# Patient Record
Sex: Female | Born: 1955 | Race: White | Hispanic: No | Smoking: Current every day smoker
Health system: Southern US, Community
[De-identification: ages and names within clinical notes are randomized; demographics above are authoritative.]

## PROBLEM LIST (undated history)

## (undated) DIAGNOSIS — J449 Chronic obstructive pulmonary disease, unspecified: Secondary | ICD-10-CM

## (undated) DIAGNOSIS — E785 Hyperlipidemia, unspecified: Secondary | ICD-10-CM

## (undated) DIAGNOSIS — Z9889 Other specified postprocedural states: Secondary | ICD-10-CM

## (undated) DIAGNOSIS — I10 Essential (primary) hypertension: Secondary | ICD-10-CM

## (undated) DIAGNOSIS — Z820 Family history of epilepsy and other diseases of the nervous system: Secondary | ICD-10-CM

## (undated) DIAGNOSIS — F419 Anxiety disorder, unspecified: Secondary | ICD-10-CM

## (undated) DIAGNOSIS — M549 Dorsalgia, unspecified: Secondary | ICD-10-CM

## (undated) DIAGNOSIS — G8929 Other chronic pain: Secondary | ICD-10-CM

## (undated) DIAGNOSIS — R112 Nausea with vomiting, unspecified: Secondary | ICD-10-CM

## (undated) DIAGNOSIS — M1990 Unspecified osteoarthritis, unspecified site: Secondary | ICD-10-CM

## (undated) DIAGNOSIS — K219 Gastro-esophageal reflux disease without esophagitis: Secondary | ICD-10-CM

## (undated) DIAGNOSIS — E039 Hypothyroidism, unspecified: Secondary | ICD-10-CM

## (undated) DIAGNOSIS — Z8719 Personal history of other diseases of the digestive system: Secondary | ICD-10-CM

## (undated) HISTORY — PX: BREAST EXCISIONAL BIOPSY: SUR124

## (undated) HISTORY — PX: BACK SURGERY: SHX140

## (undated) HISTORY — PX: TUBAL LIGATION: SHX77

## (undated) HISTORY — PX: OTHER SURGICAL HISTORY: SHX169

---

## 1999-12-31 ENCOUNTER — Encounter: Admission: RE | Admit: 1999-12-31 | Discharge: 1999-12-31 | Payer: Self-pay | Admitting: Family Medicine

## 1999-12-31 ENCOUNTER — Encounter: Payer: Self-pay | Admitting: Family Medicine

## 2000-01-31 ENCOUNTER — Encounter: Payer: Self-pay | Admitting: Neurological Surgery

## 2000-02-02 ENCOUNTER — Ambulatory Visit (HOSPITAL_COMMUNITY): Admission: RE | Admit: 2000-02-02 | Discharge: 2000-02-03 | Payer: Self-pay | Admitting: Neurological Surgery

## 2000-02-02 ENCOUNTER — Encounter: Payer: Self-pay | Admitting: Neurological Surgery

## 2001-05-06 ENCOUNTER — Emergency Department (HOSPITAL_COMMUNITY): Admission: EM | Admit: 2001-05-06 | Discharge: 2001-05-07 | Payer: Self-pay | Admitting: Emergency Medicine

## 2009-09-20 ENCOUNTER — Emergency Department (HOSPITAL_COMMUNITY): Admission: EM | Admit: 2009-09-20 | Discharge: 2009-09-20 | Payer: Self-pay | Admitting: Emergency Medicine

## 2011-01-16 ENCOUNTER — Emergency Department (HOSPITAL_COMMUNITY)
Admission: EM | Admit: 2011-01-16 | Discharge: 2011-01-16 | Disposition: A | Discharge status: Home / Self Care | Payer: BC Managed Care – PPO | Attending: Emergency Medicine | Admitting: Emergency Medicine

## 2011-01-16 DIAGNOSIS — M129 Arthropathy, unspecified: Secondary | ICD-10-CM | POA: Insufficient documentation

## 2011-01-16 DIAGNOSIS — Z79899 Other long term (current) drug therapy: Secondary | ICD-10-CM | POA: Insufficient documentation

## 2011-01-16 DIAGNOSIS — M549 Dorsalgia, unspecified: Secondary | ICD-10-CM | POA: Insufficient documentation

## 2011-01-16 DIAGNOSIS — Z139 Encounter for screening, unspecified: Secondary | ICD-10-CM | POA: Insufficient documentation

## 2011-01-16 DIAGNOSIS — E7800 Pure hypercholesterolemia, unspecified: Secondary | ICD-10-CM | POA: Insufficient documentation

## 2011-01-16 DIAGNOSIS — G8929 Other chronic pain: Secondary | ICD-10-CM | POA: Insufficient documentation

## 2011-01-16 LAB — CBC
HCT: 41.4 % (ref 36.0–46.0)
Hemoglobin: 13.9 g/dL (ref 12.0–15.0)
RBC: 5.06 MIL/uL (ref 3.87–5.11)

## 2011-01-16 LAB — RAPID URINE DRUG SCREEN, HOSP PERFORMED
Amphetamines: NOT DETECTED
Barbiturates: NOT DETECTED
Benzodiazepines: POSITIVE — AB
Opiates: NOT DETECTED

## 2011-01-16 LAB — COMPREHENSIVE METABOLIC PANEL
ALT: 24 U/L (ref 0–35)
Alkaline Phosphatase: 112 U/L (ref 39–117)
CO2: 29 mEq/L (ref 19–32)
Chloride: 106 mEq/L (ref 96–112)
GFR calc non Af Amer: >60 mL/min (ref >60)
Glucose, Bld: 99 mg/dL (ref 70–99)
Potassium: 3.5 mEq/L (ref 3.5–5.1)
Sodium: 143 mEq/L (ref 135–145)
Total Protein: 7.4 g/dL (ref 6.0–8.3)

## 2011-01-16 LAB — URINALYSIS, ROUTINE W REFLEX MICROSCOPIC
Bilirubin Urine: NEGATIVE
Hgb urine dipstick: NEGATIVE
Ketones, ur: NEGATIVE mg/dL
Protein, ur: NEGATIVE mg/dL
Urobilinogen, UA: 0.2 mg/dL (ref 0.0–1.0)

## 2011-01-16 LAB — DIFFERENTIAL
Basophils Absolute: 0.1 10*3/uL (ref 0.0–0.1)
Basophils Relative: 1 % (ref 0–1)
Lymphocytes Relative: 20 % (ref 12–46)
Monocytes Relative: 9 % (ref 3–12)
Neutro Abs: 6.1 10*3/uL (ref 1.7–7.7)
Neutrophils Relative %: 68 % (ref 43–77)

## 2011-01-16 LAB — ETHANOL: Alcohol, Ethyl (B): <5 mg/dL (ref 0–10)

## 2011-02-10 NOTE — Op Note (Signed)
[facility]  Patient:    Brooke Mccarthy, Brooke Mccarthy                     MRN: [id number] Proc. Date: 02/02/00 Adm. Date:  [id number] Disc. 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  OPERATION PERFORMED:  Left lumbar microdiskectomy with MET/RX system and microdissection technique.  SURGEON:  Stefani Dama, M.D.  ASSISTANT:  ANESTHESIA:  General endotracheal.  INDICATIONS FOR PROCEDURE:  The patient is a 55 year old individual who has had significant back and left lower extremity pain secondary to a recurrent herniated nucleus pulposus at the L4-5 level.  She was advised regarding surgery.  DESCRIPTION OF PROCEDURE:  The patient was brought to the operating room supine on a stretcher.  After smooth induction of general endotracheal anesthesia she was turned prone.  The back was shaved and prepped with DuraPrep and draped in sterile fashion.  The fluoroscopy unit was brought into view and AP and lateral fluoroscopy was utilized to isolate the L4 laminar arch on the left lateral aspect of the L4-5 interspace.  Then, by placing a K-wire on the inferior marginal L4 lamina, the inner cannula was placed after incision was made around the needle hole measuring approximately 14 mm in length.  The outer cannula was then driven down over the inner cannula and an area was wanded between the L4-5 interlaminar space.  The wanding continued until the 18 mm cannula was placed down to the wound.  Then the 18 mm trocar measuring 6 cm in depth was connected to the side arm on the operating room table to secure it.  Fluoroscopy again identified good position of the trocar. The microscope was then draped and  brought into the field and then using microdissection technique, a laminotomy was performed, removing the inferior margin of the lamina of L4 out to the mesial wall of the facet.  The electric high speed drill was then used to remove the inferior margin of bone out to the mesial wall of the facet and 2 and 3 mm Kerrison punches were used to remove bone so as to allow exposure of the common dural tube with lateral aspect of the common dural tube being exposed and taking out the yellow ligament in this region.  The edge of the tube and the L5 nerve root and its take off were identified.  Further dissection and release of scar tissue from this area allowed mobilization of the nerve which was tightly adhered and obviously dorsally displaced.  With further mobility, a disk fragment was visualized just on the lateral and ventral aspect of the nerve root and then with further dissection, the disk fragment was mobilized so that after significant amount of dissection was performed, a fragment of disk could be removed.  This allowed for some decompression of the common dural tube and the nerve root.  Further inspection then identified a second fragment and with dissection of this fragment using the available upbiting dissecting tools and bayonetted dental tools.  A second larger fragment of disk was removed from underneath the sac and the L5 nerve root.  This allowed for good decompression of the  nerve root itself.  Further release of scar tissue with microdissection technique allowed greater mobilization of the L5 nerve root and inspection then identified a rent in the posterior longitudinal ligament where the disk fragment had migrated from.  The disk space was then inspected and diskectomy was performed removing several other degenerated fragments of disk from within the disk space and freeing up any other loose material within the disk space and removing it in a piecemeal fashion.  The disk space  was then irrigated fully and once completely evacuated of any free remnants of disk material, the area around the nerve root was inspected.  Hemostasis from the soft tissues was obtained using the bipolar cautery and then the wound was closed by removing the cannula slowly and placing a single 3-0 stitch in the lumbar dorsal fascia on that left side. The skin was closed with 3-0 Vicryl in interrupted fashion.  The patient tolerated the procedure well.  Blood loss was essentially nil. DD:  02/02/00 TD:  02/05/00 Job: [id number]

## 2011-02-10 NOTE — H&P (Signed)
[place]  Patient:    Brooke Mccarthy, Brooke Mccarthy               MRN: [id number] Adm. Date:  02/02/00 Attending:  Stefani Dama, M.D.                         History and Physical  ADMISSION DIAGNOSES:  Herniated nucleus pulposus L4-L5 left, with left lumbar radiculopathy.  HISTORY OF PRESENT ILLNESS:  The patient is a 55 year old, right-handed individual who was treated for back and left lower extremity pain, secondary to a herniated nucleus pulposus at L4-5, resected in 1990.  She had had some intermittent occasional problems with back pain and was last seen in 1996, for a recurrence f back pain and some pain in the left lower extremity.  Conservative management was tolerated well; however, the patient about three months ago developed severe excruciating pain in the back and the left lower extremity that has been unrelenting.  She notes that this is more severe than had been previously.  She  notes that the pain is unbearable.  She cannot stand, sit, walk, or lie down, or get comfortable in any given position for any length of time.  Sleeping has been very difficult.  She finds it difficult to maintain any degree of comfort for any length of time.  She has been working despite the pain, and she has been taking  ibuprofen.  More recently she has been given Lodine, but she notes that this does not seem to help the pain.  Bowel and bladder control has not been effected. Coughing and sneezing do not seem to aggravate the pain or relieve it in any way.  PAST MEDICAL HISTORY:  Reveals that her general health has been good, though she has had hypertension some time ago.  There is some concern that the hypertension was related to ongoing back pain and leg pain.  She has tried previous breast biopsies for benign tumors on two occasions.  She has had surgery on the right oot with lower lumbar spine surgery done in 1990.  FAMILY HISTORY:   Reveals that her family history is negative.  Her father died n 84, of cancer, but there is no significant difficulty with liver, heart, or kidney disease that she can trace.  SOCIAL HISTORY:  She is separated.  She works as a Administrator, sports.  She has three children, all of them outside the home.  REVIEW OF SYSTEMS:  Notable for high blood pressure, leg weakness, back pain, and leg pain.  PHYSICAL EXAMINATION:  GENERAL:  She is an alert, oriented, cooperative individual, who is in moderate  distress with her left leg pain.  She does not maintain one given position while sitting for the interview and examination in the office.  She will stand straight and erect with some difficulty, but tends to prefer to lean against an object, s she has significant leg pain when standing for a few minutes too long.  She tends to feel a little more comfortable with the left leg crossed.  BACK:  Is able to flex 70 degrees.  There is tenderness to palpation across the  level of the L5 vertebra.  NEUROLOGIC:  Motor strength in the lower extremities reveals the iliopsoas, quadriceps, tibialis anterior, and gastrocnemius have normal strength, tone, and bulk.  Deep tendon reflexes are 2+ in the patellae, 1+ in the right Achilles, absent in the left Achilles.  Babinski is downgoing.  Sensation is diminished to vibration distally in the left lower extremity.  Pin sensation is diminished also from the level of the ankle to the foot.  Straight leg raising reproduces leg pain at 80 on the left side, and negative on the right side to 90 degrees.  Patricks  maneuver is negative bilaterally.  Cranial nerves examination reveals her pupils are 3.0 mm, briskly reactive to light and accommodation.  The extraocular movements are full.  The face is symmetric to grimace.  Tongue and uvula are in the midline. Sclerae and conjunctivae are clear.  NECK:  No masses, no bruits are heard.  LUNGS:  Clear to  auscultation.  HEART:  A regular rate and rhythm.  ABDOMEN:  Soft, bowel sounds positive.  No masses are palpable.  EXTREMITIES:  Reveal no cyanosis, clubbing, or edema.  VITAL SIGNS:  Blood pressure on initial examination is 156/94, heart rate 72 and regular, respirations 14 and unlabored.  IMPRESSION:  The patient has a herniated nucleus pulposus at L4-5 on the left with left lumbar radiculopathy.  PLAN:  She is now being admitted to undergo a surgical microdiskectomy and resection of disk herniation. DD:  02/02/00 TD:  02/02/00 Job: [id number]

## 2013-02-24 ENCOUNTER — Other Ambulatory Visit: Payer: Self-pay | Admitting: Family Medicine

## 2013-02-24 DIAGNOSIS — Z780 Asymptomatic menopausal state: Secondary | ICD-10-CM

## 2013-03-06 ENCOUNTER — Ambulatory Visit
Admission: RE | Admit: 2013-03-06 | Discharge: 2013-03-06 | Disposition: A | Discharge status: Home / Self Care | Payer: BC Managed Care – PPO | Source: Ambulatory Visit | Attending: Family Medicine | Admitting: Family Medicine

## 2013-03-06 DIAGNOSIS — Z780 Asymptomatic menopausal state: Secondary | ICD-10-CM

## 2013-10-10 ENCOUNTER — Inpatient Hospital Stay (HOSPITAL_COMMUNITY)
Admission: EM | Admit: 2013-10-10 | Discharge: 2013-10-11 | DRG: 482 | Disposition: A | Discharge status: Home / Self Care | Payer: BC Managed Care – PPO | Attending: Orthopedic Surgery | Admitting: Orthopedic Surgery

## 2013-10-10 ENCOUNTER — Inpatient Hospital Stay (HOSPITAL_COMMUNITY): Payer: BC Managed Care – PPO | Admitting: Anesthesiology

## 2013-10-10 ENCOUNTER — Other Ambulatory Visit: Payer: Self-pay | Admitting: Orthopedic Surgery

## 2013-10-10 ENCOUNTER — Inpatient Hospital Stay (HOSPITAL_COMMUNITY): Payer: BC Managed Care – PPO

## 2013-10-10 ENCOUNTER — Encounter (HOSPITAL_COMMUNITY): Payer: Self-pay | Admitting: Emergency Medicine

## 2013-10-10 ENCOUNTER — Emergency Department (HOSPITAL_COMMUNITY): Payer: BC Managed Care – PPO

## 2013-10-10 ENCOUNTER — Encounter (HOSPITAL_COMMUNITY): Payer: BC Managed Care – PPO | Admitting: Anesthesiology

## 2013-10-10 ENCOUNTER — Encounter (HOSPITAL_COMMUNITY)
Admission: EM | Disposition: A | Discharge status: Home / Self Care | Payer: Self-pay | Source: Home / Self Care | Attending: Orthopedic Surgery

## 2013-10-10 DIAGNOSIS — F17200 Nicotine dependence, unspecified, uncomplicated: Secondary | ICD-10-CM | POA: Diagnosis present

## 2013-10-10 DIAGNOSIS — Y92009 Unspecified place in unspecified non-institutional (private) residence as the place of occurrence of the external cause: Secondary | ICD-10-CM

## 2013-10-10 DIAGNOSIS — K219 Gastro-esophageal reflux disease without esophagitis: Secondary | ICD-10-CM | POA: Diagnosis present

## 2013-10-10 DIAGNOSIS — S7292XA Unspecified fracture of left femur, initial encounter for closed fracture: Secondary | ICD-10-CM | POA: Diagnosis present

## 2013-10-10 DIAGNOSIS — Z7901 Long term (current) use of anticoagulants: Secondary | ICD-10-CM

## 2013-10-10 DIAGNOSIS — F411 Generalized anxiety disorder: Secondary | ICD-10-CM | POA: Diagnosis present

## 2013-10-10 DIAGNOSIS — W010XXA Fall on same level from slipping, tripping and stumbling without subsequent striking against object, initial encounter: Secondary | ICD-10-CM | POA: Diagnosis present

## 2013-10-10 DIAGNOSIS — E785 Hyperlipidemia, unspecified: Secondary | ICD-10-CM | POA: Diagnosis present

## 2013-10-10 DIAGNOSIS — G8929 Other chronic pain: Secondary | ICD-10-CM | POA: Diagnosis present

## 2013-10-10 DIAGNOSIS — I10 Essential (primary) hypertension: Secondary | ICD-10-CM | POA: Diagnosis present

## 2013-10-10 DIAGNOSIS — S72343A Displaced spiral fracture of shaft of unspecified femur, initial encounter for closed fracture: Secondary | ICD-10-CM | POA: Diagnosis present

## 2013-10-10 DIAGNOSIS — S72309A Unspecified fracture of shaft of unspecified femur, initial encounter for closed fracture: Principal | ICD-10-CM | POA: Diagnosis present

## 2013-10-10 DIAGNOSIS — E039 Hypothyroidism, unspecified: Secondary | ICD-10-CM | POA: Diagnosis present

## 2013-10-10 DIAGNOSIS — Z79899 Other long term (current) drug therapy: Secondary | ICD-10-CM

## 2013-10-10 DIAGNOSIS — S72346A Nondisplaced spiral fracture of shaft of unspecified femur, initial encounter for closed fracture: Secondary | ICD-10-CM

## 2013-10-10 HISTORY — DX: Anxiety disorder, unspecified: F41.9

## 2013-10-10 HISTORY — DX: Hypothyroidism, unspecified: E03.9

## 2013-10-10 HISTORY — DX: Hyperlipidemia, unspecified: E78.5

## 2013-10-10 HISTORY — DX: Nausea with vomiting, unspecified: R11.2

## 2013-10-10 HISTORY — DX: Unspecified osteoarthritis, unspecified site: M19.90

## 2013-10-10 HISTORY — DX: Dorsalgia, unspecified: M54.9

## 2013-10-10 HISTORY — DX: Family history of epilepsy and other diseases of the nervous system: Z82.0

## 2013-10-10 HISTORY — DX: Personal history of other diseases of the digestive system: Z87.19

## 2013-10-10 HISTORY — DX: Other specified postprocedural states: Z98.890

## 2013-10-10 HISTORY — DX: Other chronic pain: G89.29

## 2013-10-10 HISTORY — PX: FEMUR IM NAIL: SHX1597

## 2013-10-10 HISTORY — DX: Gastro-esophageal reflux disease without esophagitis: K21.9

## 2013-10-10 HISTORY — DX: Essential (primary) hypertension: I10

## 2013-10-10 LAB — CBC WITH DIFFERENTIAL/PLATELET
Basophils Absolute: 0 10*3/uL (ref 0.0–0.1)
Basophils Relative: 0 % (ref 0–1)
EOS ABS: 0 10*3/uL (ref 0.0–0.7)
EOS PCT: 0 % (ref 0–5)
HCT: 43.3 % (ref 36.0–46.0)
HEMOGLOBIN: 14.5 g/dL (ref 12.0–15.0)
LYMPHS ABS: 1.1 10*3/uL (ref 0.7–4.0)
LYMPHS PCT: 8 % — AB (ref 12–46)
MCH: 27.5 pg (ref 26.0–34.0)
MCHC: 33.5 g/dL (ref 30.0–36.0)
MCV: 82.2 fL (ref 78.0–100.0)
MONOS PCT: 7 % (ref 3–12)
Monocytes Absolute: 0.9 10*3/uL (ref 0.1–1.0)
NEUTROS PCT: 85 % — AB (ref 43–77)
Neutro Abs: 11.3 10*3/uL — ABNORMAL HIGH (ref 1.7–7.7)
PLATELETS: 314 10*3/uL (ref 150–400)
RBC: 5.27 MIL/uL — AB (ref 3.87–5.11)
RDW: 13.5 % (ref 11.5–15.5)
WBC: 13.4 10*3/uL — AB (ref 4.0–10.5)

## 2013-10-10 LAB — SURGICAL PCR SCREEN
MRSA, PCR: NEGATIVE
Staphylococcus aureus: NEGATIVE

## 2013-10-10 LAB — BASIC METABOLIC PANEL
BUN: 16 mg/dL (ref 6–23)
CO2: 26 meq/L (ref 19–32)
Calcium: 9.8 mg/dL (ref 8.4–10.5)
Chloride: 99 mEq/L (ref 96–112)
Creatinine, Ser: 0.91 mg/dL (ref 0.50–1.10)
GFR calc Af Amer: 78 mL/min — ABNORMAL LOW (ref >90)
GFR calc non Af Amer: 68 mL/min — ABNORMAL LOW (ref >90)
GLUCOSE: 135 mg/dL — AB (ref 70–99)
POTASSIUM: 4.9 meq/L (ref 3.7–5.3)
SODIUM: 139 meq/L (ref 137–147)

## 2013-10-10 SURGERY — INSERTION, INTRAMEDULLARY ROD, FEMUR, RETROGRADE
Anesthesia: General | Site: Leg Upper | Laterality: Left

## 2013-10-10 MED ORDER — DOCUSATE SODIUM 100 MG PO CAPS
100.0000 mg | ORAL_CAPSULE | Freq: Two times a day (BID) | ORAL | Status: DC
  Administered 2013-10-10 – 2013-10-11 (×2): 100 mg via ORAL
  Filled 2013-10-10 (×3): qty 1

## 2013-10-10 MED ORDER — MORPHINE SULFATE 2 MG/ML IJ SOLN: 1.0000 mg | INTRAMUSCULAR | Status: DC | PRN

## 2013-10-10 MED ORDER — OXYCODONE-ACETAMINOPHEN 7.5-325 MG PO TABS
1.0000 | ORAL_TABLET | ORAL | Status: DC | PRN
  Filled 2013-10-10: qty 1

## 2013-10-10 MED ORDER — ROCURONIUM BROMIDE 100 MG/10ML IV SOLN
INTRAVENOUS | Status: DC | PRN
  Administered 2013-10-10: 10 mg via INTRAVENOUS
  Administered 2013-10-10: 50 mg via INTRAVENOUS

## 2013-10-10 MED ORDER — OXYCODONE HCL 5 MG PO TABS: 5.0000 mg | ORAL_TABLET | ORAL | Status: DC | PRN

## 2013-10-10 MED ORDER — ONDANSETRON HCL 4 MG PO TABS: 4.0000 mg | ORAL_TABLET | Freq: Four times a day (QID) | ORAL | Status: DC | PRN

## 2013-10-10 MED ORDER — ALPRAZOLAM 0.5 MG PO TABS
1.0000 mg | ORAL_TABLET | Freq: Two times a day (BID) | ORAL | Status: DC
  Administered 2013-10-11 (×2): 1 mg via ORAL
  Filled 2013-10-10: qty 2
  Filled 2013-10-10: qty 4
  Filled 2013-10-10: qty 2

## 2013-10-10 MED ORDER — ACETAMINOPHEN 500 MG PO TABS: 1000.0000 mg | ORAL_TABLET | Freq: Once | ORAL | Status: DC

## 2013-10-10 MED ORDER — OXYCODONE-ACETAMINOPHEN 5-325 MG PO TABS: 1.0000 | ORAL_TABLET | ORAL | Status: DC | PRN

## 2013-10-10 MED ORDER — SUCCINYLCHOLINE CHLORIDE 20 MG/ML IJ SOLN
INTRAMUSCULAR | Status: DC | PRN
  Administered 2013-10-10: 70 mg via INTRAVENOUS

## 2013-10-10 MED ORDER — PROPOFOL 10 MG/ML IV BOLUS
INTRAVENOUS | Status: DC | PRN
  Administered 2013-10-10: 200 mg via INTRAVENOUS

## 2013-10-10 MED ORDER — LEVOTHYROXINE SODIUM 100 MCG PO TABS
100.0000 ug | ORAL_TABLET | Freq: Every day | ORAL | Status: DC
  Administered 2013-10-11: 100 ug via ORAL
  Filled 2013-10-10 (×2): qty 1

## 2013-10-10 MED ORDER — LIDOCAINE HCL (CARDIAC) 20 MG/ML IV SOLN
INTRAVENOUS | Status: DC | PRN
  Administered 2013-10-10: 50 mg via INTRAVENOUS

## 2013-10-10 MED ORDER — SODIUM CHLORIDE 0.9 % IV SOLN
INTRAVENOUS | Status: DC
  Administered 2013-10-10: 12:00:00 via INTRAVENOUS

## 2013-10-10 MED ORDER — BACITRACIN ZINC 500 UNIT/GM EX OINT
TOPICAL_OINTMENT | CUTANEOUS | Status: AC
  Filled 2013-10-10: qty 15

## 2013-10-10 MED ORDER — GLYCOPYRROLATE 0.2 MG/ML IJ SOLN
INTRAMUSCULAR | Status: DC | PRN
  Administered 2013-10-10: .8 mg via INTRAVENOUS

## 2013-10-10 MED ORDER — OXYCODONE HCL 5 MG PO TABS: 2.5000 mg | ORAL_TABLET | ORAL | Status: DC | PRN

## 2013-10-10 MED ORDER — CEFAZOLIN SODIUM-DEXTROSE 2-3 GM-% IV SOLR
2.0000 g | INTRAVENOUS | Status: DC
  Filled 2013-10-10: qty 50

## 2013-10-10 MED ORDER — NEOSTIGMINE METHYLSULFATE 1 MG/ML IJ SOLN
INTRAMUSCULAR | Status: DC | PRN
  Administered 2013-10-10: 5 mg via INTRAVENOUS

## 2013-10-10 MED ORDER — CEFAZOLIN SODIUM-DEXTROSE 2-3 GM-% IV SOLR
INTRAVENOUS | Status: AC
  Administered 2013-10-10: 2 g via INTRAVENOUS
  Filled 2013-10-10: qty 50

## 2013-10-10 MED ORDER — SODIUM CHLORIDE 0.9 % IV SOLN: INTRAVENOUS | Status: DC

## 2013-10-10 MED ORDER — ONDANSETRON HCL 4 MG/2ML IJ SOLN: 4.0000 mg | Freq: Once | INTRAMUSCULAR | Status: DC | PRN

## 2013-10-10 MED ORDER — HYDROMORPHONE HCL PF 1 MG/ML IJ SOLN
1.0000 mg | Freq: Once | INTRAMUSCULAR | Status: AC | PRN
  Administered 2013-10-10: 1 mg via INTRAVENOUS
  Filled 2013-10-10: qty 1

## 2013-10-10 MED ORDER — OXYCODONE HCL 5 MG PO TABS
5.0000 mg | ORAL_TABLET | ORAL | Status: DC | PRN
  Administered 2013-10-11 (×3): 5 mg via ORAL
  Filled 2013-10-10: qty 2
  Filled 2013-10-10 (×2): qty 1

## 2013-10-10 MED ORDER — SODIUM CHLORIDE 0.9 % IV SOLN
INTRAVENOUS | Status: AC
  Administered 2013-10-10: 14:00:00 via INTRAVENOUS

## 2013-10-10 MED ORDER — FENTANYL CITRATE 0.05 MG/ML IJ SOLN
INTRAMUSCULAR | Status: DC | PRN
  Administered 2013-10-10: 50 ug via INTRAVENOUS
  Administered 2013-10-10: 100 ug via INTRAVENOUS
  Administered 2013-10-10: 50 ug via INTRAVENOUS

## 2013-10-10 MED ORDER — METOCLOPRAMIDE HCL 10 MG PO TABS: 5.0000 mg | ORAL_TABLET | Freq: Three times a day (TID) | ORAL | Status: DC | PRN

## 2013-10-10 MED ORDER — MELOXICAM 15 MG PO TABS
15.0000 mg | ORAL_TABLET | Freq: Every day | ORAL | Status: DC
  Administered 2013-10-11: 15 mg via ORAL
  Filled 2013-10-10: qty 1

## 2013-10-10 MED ORDER — ONDANSETRON HCL 4 MG/2ML IJ SOLN
INTRAMUSCULAR | Status: DC | PRN
  Administered 2013-10-10: 4 mg via INTRAVENOUS

## 2013-10-10 MED ORDER — SIMVASTATIN 5 MG PO TABS
5.0000 mg | ORAL_TABLET | Freq: Every day | ORAL | Status: DC
  Filled 2013-10-10: qty 1

## 2013-10-10 MED ORDER — DILTIAZEM HCL ER 240 MG PO CP24
240.0000 mg | ORAL_CAPSULE | Freq: Every day | ORAL | Status: DC
  Administered 2013-10-11: 240 mg via ORAL
  Filled 2013-10-10: qty 1

## 2013-10-10 MED ORDER — MIDAZOLAM HCL 5 MG/5ML IJ SOLN
INTRAMUSCULAR | Status: DC | PRN
  Administered 2013-10-10: 2 mg via INTRAVENOUS

## 2013-10-10 MED ORDER — LACTATED RINGERS IV SOLN
INTRAVENOUS | Status: DC | PRN
  Administered 2013-10-10 (×2): via INTRAVENOUS

## 2013-10-10 MED ORDER — 0.9 % SODIUM CHLORIDE (POUR BTL) OPTIME
TOPICAL | Status: DC | PRN
  Administered 2013-10-10: 1000 mL

## 2013-10-10 MED ORDER — LACTATED RINGERS IV SOLN
INTRAVENOUS | Status: DC
  Administered 2013-10-10: 50 mL/h via INTRAVENOUS

## 2013-10-10 MED ORDER — HYDROMORPHONE HCL PF 1 MG/ML IJ SOLN
0.2500 mg | INTRAMUSCULAR | Status: DC | PRN
  Administered 2013-10-10 (×2): 0.25 mg via INTRAVENOUS

## 2013-10-10 MED ORDER — SENNA 8.6 MG PO TABS
1.0000 | ORAL_TABLET | Freq: Two times a day (BID) | ORAL | Status: DC
  Administered 2013-10-10 – 2013-10-11 (×2): 8.6 mg via ORAL
  Filled 2013-10-10 (×3): qty 1

## 2013-10-10 MED ORDER — ONDANSETRON HCL 4 MG/2ML IJ SOLN: 4.0000 mg | Freq: Four times a day (QID) | INTRAMUSCULAR | Status: DC | PRN

## 2013-10-10 MED ORDER — HYDROMORPHONE HCL PF 1 MG/ML IJ SOLN
1.0000 mg | Freq: Once | INTRAMUSCULAR | Status: AC
  Administered 2013-10-10: 1 mg via INTRAVENOUS
  Filled 2013-10-10: qty 1

## 2013-10-10 MED ORDER — ENOXAPARIN SODIUM 30 MG/0.3ML ~~LOC~~ SOLN
30.0000 mg | SUBCUTANEOUS | Status: DC
  Administered 2013-10-10: 30 mg via SUBCUTANEOUS
  Filled 2013-10-10 (×2): qty 0.3

## 2013-10-10 MED ORDER — HYDROMORPHONE HCL PF 1 MG/ML IJ SOLN
INTRAMUSCULAR | Status: AC
  Filled 2013-10-10: qty 1

## 2013-10-10 MED ORDER — SODIUM CHLORIDE 0.9 % IV SOLN
INTRAVENOUS | Status: DC
  Administered 2013-10-10: 23:00:00 via INTRAVENOUS

## 2013-10-10 MED ORDER — CHLORHEXIDINE GLUCONATE 4 % EX LIQD
1.0000 "application " | Freq: Once | CUTANEOUS | Status: DC
  Filled 2013-10-10: qty 15

## 2013-10-10 MED ORDER — ENOXAPARIN SODIUM 40 MG/0.4ML ~~LOC~~ SOLN: 40.0000 mg | SUBCUTANEOUS | Status: DC

## 2013-10-10 MED ORDER — ROPINIROLE HCL 0.5 MG PO TABS
0.5000 mg | ORAL_TABLET | Freq: Every evening | ORAL | Status: DC | PRN
  Filled 2013-10-10: qty 2

## 2013-10-10 MED ORDER — METOCLOPRAMIDE HCL 5 MG/ML IJ SOLN: 5.0000 mg | Freq: Three times a day (TID) | INTRAMUSCULAR | Status: DC | PRN

## 2013-10-10 MED ORDER — SPIRONOLACTONE-HCTZ 25-25 MG PO TABS
1.0000 | ORAL_TABLET | Freq: Every day | ORAL | Status: DC
  Filled 2013-10-10: qty 1

## 2013-10-10 MED ORDER — CHLORHEXIDINE GLUCONATE 4 % EX LIQD
60.0000 mL | Freq: Once | CUTANEOUS | Status: DC
  Filled 2013-10-10: qty 60

## 2013-10-10 SURGICAL SUPPLY — 58 items
BANDAGE ELASTIC 4 VELCRO ST LF (GAUZE/BANDAGES/DRESSINGS) ×3 IMPLANT
BANDAGE ESMARK 6X9 LF (GAUZE/BANDAGES/DRESSINGS) IMPLANT
BIT DRILL CALIBRATED 4.3MMX365 (DRILL) IMPLANT
BIT DRILL CROWE PNT TWST 4.5MM (DRILL) IMPLANT
BNDG CMPR 9X6 STRL LF SNTH (GAUZE/BANDAGES/DRESSINGS)
BNDG CMPR MED 15X6 ELC VLCR LF (GAUZE/BANDAGES/DRESSINGS) ×1
BNDG COHESIVE 4X5 TAN STRL (GAUZE/BANDAGES/DRESSINGS) ×3 IMPLANT
BNDG COHESIVE 6X5 TAN STRL LF (GAUZE/BANDAGES/DRESSINGS) ×3 IMPLANT
BNDG ELASTIC 6X15 VLCR STRL LF (GAUZE/BANDAGES/DRESSINGS) ×2 IMPLANT
BNDG ESMARK 6X9 LF (GAUZE/BANDAGES/DRESSINGS)
CLOTH BEACON ORANGE TIMEOUT ST (SAFETY) ×1 IMPLANT
COVER SURGICAL LIGHT HANDLE (MISCELLANEOUS) ×6 IMPLANT
CUFF TOURNIQUET SINGLE 34IN LL (TOURNIQUET CUFF) ×1 IMPLANT
CUFF TOURNIQUET SINGLE 44IN (TOURNIQUET CUFF) IMPLANT
DRAPE C-ARM 42X72 X-RAY (DRAPES) ×3 IMPLANT
DRAPE C-ARMOR (DRAPES) ×2 IMPLANT
DRAPE INCISE IOBAN 66X45 STRL (DRAPES) ×1 IMPLANT
DRAPE ORTHO SPLIT 77X108 STRL (DRAPES) ×6
DRAPE SURG ORHT 6 SPLT 77X108 (DRAPES) ×2 IMPLANT
DRAPE U-SHAPE 47X51 STRL (DRAPES) ×3 IMPLANT
DRILL CALIBRATED 4.3MMX365 (DRILL) ×3
DRILL CROWE POINT TWIST 4.5MM (DRILL) ×3
DRSG ADAPTIC 3X8 NADH LF (GAUZE/BANDAGES/DRESSINGS) ×3 IMPLANT
DRSG PAD ABDOMINAL 8X10 ST (GAUZE/BANDAGES/DRESSINGS) ×2 IMPLANT
ELECT REM PT RETURN 9FT ADLT (ELECTROSURGICAL) ×3
ELECTRODE REM PT RTRN 9FT ADLT (ELECTROSURGICAL) ×1 IMPLANT
EVACUATOR 1/8 PVC DRAIN (DRAIN) IMPLANT
GLOVE BIO SURGEON STRL SZ7 (GLOVE) ×3 IMPLANT
GLOVE BIO SURGEON STRL SZ8 (GLOVE) ×8 IMPLANT
GLOVE BIOGEL PI IND STRL 7.5 (GLOVE) ×1 IMPLANT
GLOVE BIOGEL PI IND STRL 8 (GLOVE) ×1 IMPLANT
GLOVE BIOGEL PI INDICATOR 7.5 (GLOVE) ×2
GLOVE BIOGEL PI INDICATOR 8 (GLOVE) ×2
GOWN STRL NON-REIN LRG LVL3 (GOWN DISPOSABLE) ×9 IMPLANT
GUIDEWIRE BEAD TIP (WIRE) ×2 IMPLANT
KIT BASIN OR (CUSTOM PROCEDURE TRAY) ×3 IMPLANT
KIT ROOM TURNOVER OR (KITS) ×3 IMPLANT
NAIL FEM RETRO 9X320 (Nail) ×2 IMPLANT
PACK ORTHO EXTREMITY (CUSTOM PROCEDURE TRAY) ×3 IMPLANT
PAD ARMBOARD 7.5X6 YLW CONV (MISCELLANEOUS) ×6 IMPLANT
SCREW CORT TI DBL LEAD 5X34 (Screw) ×2 IMPLANT
SCREW CORT TI DBL LEAD 5X46 (Screw) ×2 IMPLANT
SCREW CORT TI DBL LEAD 5X50 (Screw) ×2 IMPLANT
SCREW CORT TI DBL LEAD 5X65 (Screw) ×2 IMPLANT
SPONGE GAUZE 4X4 12PLY (GAUZE/BANDAGES/DRESSINGS) ×3 IMPLANT
SPONGE LAP 18X18 X RAY DECT (DISPOSABLE) ×3 IMPLANT
STAPLER VISISTAT 35W (STAPLE) ×3 IMPLANT
SUT ETHILON 3 0 PS 1 (SUTURE) ×4 IMPLANT
SUT VIC AB 0 CT1 27 (SUTURE)
SUT VIC AB 0 CT1 27XBRD ANBCTR (SUTURE) IMPLANT
SUT VIC AB 2-0 CT1 27 (SUTURE)
SUT VIC AB 2-0 CT1 TAPERPNT 27 (SUTURE) IMPLANT
TOWEL OR 17X24 6PK STRL BLUE (TOWEL DISPOSABLE) ×3 IMPLANT
TOWEL OR 17X26 10 PK STRL BLUE (TOWEL DISPOSABLE) ×3 IMPLANT
TUBE CONNECTING 12'X1/4 (SUCTIONS) ×1
TUBE CONNECTING 12X1/4 (SUCTIONS) ×2 IMPLANT
UNDERPAD 30X30 INCONTINENT (UNDERPADS AND DIAPERS) ×3 IMPLANT
YANKAUER SUCT BULB TIP NO VENT (SUCTIONS) ×3 IMPLANT

## 2013-10-10 NOTE — ED Notes (Addendum)
Pt from home. L knee gave out when pt stepped on ice. EMS states L knee was rotated outward from line of femur when they arrived. EMS splinted knee on pillows and boards. Pedal pulse strong. Pt denies other complaints. 200 mcg total of fentanyl given prior to arrival by EMS

## 2013-10-10 NOTE — Transfer of Care (Signed)
Immediate Anesthesia Transfer of Care Note  Patient: Brooke Mccarthy  Procedure(s) Performed: Procedure(s): INTRAMEDULLARY (IM) RETROGRADE FEMORAL NAILING (Left)  Patient Location: PACU  Anesthesia Type:General  Level of Consciousness: awake, alert  and oriented  Airway & Oxygen Therapy: Patient Spontanous Breathing and Patient connected to nasal cannula oxygen  Post-op Assessment: Report given to PACU RN and Post -op Vital signs reviewed and stable  Post vital signs: Reviewed and stable  Complications: No apparent anesthesia complications

## 2013-10-10 NOTE — Progress Notes (Signed)
   CARE MANAGEMENT ED NOTE 10/10/2013  Patient:  Brooke Mccarthy,Brooke Mccarthy   Account Number:  [id number]  Date Initiated:  10/10/2013  Documentation initiated by:  Edd ArbourGIBBS,KIMBERLY  Subjective/Objective Assessment:   58 yr old bcbs [place] ppo pt without pcp listed states pcp is Brooke Mccarthy     Subjective/Objective Assessment Detail:     Action/Plan:   Action/Plan Detail:   EPIC updated   Anticipated DC Date:  10/13/2013     Status Recommendation to Physician:   Result of Recommendation:    Other ED Services  Consult Working Plan    DC Planning Services  Other  PCP issues  Outpatient Services - Pt will follow up    Choice offered to / List presented to:            Status of service:  Completed, signed off  ED Comments:   ED Comments Detail:

## 2013-10-10 NOTE — Brief Op Note (Signed)
10/10/2013  9:21 PM  PATIENT:  Brooke Mccarthy  58 y.o. female  PRE-OPERATIVE DIAGNOSIS:  left femoral shaft fracture  POST-OPERATIVE DIAGNOSIS:  left femoral shaft fracture  Procedure(s): INTRAMEDULLARY (IM) RETROGRADE FEMORAL NAILING of left femoral shaft fracture  SURGEON:  Toni ArthursJohn Paiten Boies, MD  ASSISTANT: Scott Flowers, PA-C  ANESTHESIA:   General  EBL:  250 cc  TOURNIQUET:  n/a  COMPLICATIONS:  None apparent  DISPOSITION:  Extubated, awake and stable to recovery.  DICTATION ID:  [id number]

## 2013-10-10 NOTE — Anesthesia Postprocedure Evaluation (Signed)
  Anesthesia Post-op Note  Patient: Brooke Mccarthy  Procedure(s) Performed: Procedure(s): INTRAMEDULLARY (IM) RETROGRADE FEMORAL NAILING (Left)  Patient Location: PACU  Anesthesia Type:General  Level of Consciousness: awake, alert , oriented and patient cooperative  Airway and Oxygen Therapy: Patient Spontanous Breathing  Post-op Pain: mild  Post-op Assessment: Post-op Vital signs reviewed, Patient's Cardiovascular Status Stable, Respiratory Function Stable, Patent Airway, No signs of Nausea or vomiting and Pain level controlled  Post-op Vital Signs: stable  Complications: No apparent anesthesia complications

## 2013-10-10 NOTE — Anesthesia Preprocedure Evaluation (Signed)
Anesthesia Evaluation  Patient identified by MRN, date of birth, ID band Patient awake    Reviewed: Allergy & Precautions, H&P , NPO status , Patient's Chart, lab work & pertinent test results  History of Anesthesia Complications (+) PONV  Airway       Dental   Pulmonary Current Smoker,          Cardiovascular hypertension,     Neuro/Psych    GI/Hepatic GERD-  ,  Endo/Other  Hypothyroidism   Renal/GU      Musculoskeletal   Abdominal   Peds  Hematology   Anesthesia Other Findings   Reproductive/Obstetrics                           Anesthesia Physical Anesthesia Plan  ASA: II  Anesthesia Plan: General   Post-op Pain Management:    Induction: Intravenous  Airway Management Planned: Oral ETT  Additional Equipment:   Intra-op Plan:   Post-operative Plan: Extubation in OR  Informed Consent:   Plan Discussed with:   Anesthesia Plan Comments:         Anesthesia Quick Evaluation

## 2013-10-10 NOTE — ED Provider Notes (Signed)
CSN: [id number]     Arrival date & time 10/10/13  1018 History   First MD Initiated Contact with Patient 10/10/13 1020     Chief Complaint  Patient presents with  . Knee Injury   (Consider location/radiation/quality/duration/timing/severity/associated sxs/prior Treatment) HPI  Pt to the ER by EMS for evaluation after a slip on the ice injuring her left knee with obvious deformity. She states that she stepped the left leg out of the house and didn't realize it was icy. Before her other leg go out the door her leg gave out from under her and twisted. EMS says that her left femur was turned inwards and her lower leg was turned outwards, in effort to immobilze patient the leg was somewhat relocated. She denies loc or neck pain. Denies hip pain or pain anywhere else.   Given Fentanyl by EMS in route,   History reviewed. No pertinent past medical history. Past Surgical History  Procedure Laterality Date  . Back surgery     History reviewed. No pertinent family history. History  Substance Use Topics  . Smoking status: Current Every Day Smoker  . Smokeless tobacco: Not on file  . Alcohol Use: No   OB History   Grav Para Term Preterm Abortions TAB SAB Ect Mult Living                 Review of Systems The patient denies anorexia, fever, weight loss, vision loss, decreased hearing, hoarseness, chest pain, syncope, dyspnea on exertion, peripheral edema, balance deficits, hemoptysis, abdominal pain, melena, hematochezia, severe indigestion/heartburn, hematuria, incontinence, genital sores, muscle weakness, suspicious skin lesions, transient blindness, difficulty walking, depression, unusual weight change, abnormal bleeding, enlarged lymph nodes, angioedema, and breast masses.  Allergies  Ultram  Home Medications   Current Outpatient Rx  Name  Route  Sig  Dispense  Refill  . ALPRAZolam (XANAX) 1 MG tablet   Oral   Take 1-2 tablets by mouth 2 (two) times daily.          Marland Kitchen  diltiazem (DILACOR XR) 240 MG 24 hr capsule   Oral   Take 240 mg by mouth daily.         Marland Kitchen levothyroxine (SYNTHROID, LEVOTHROID) 100 MCG tablet   Oral   Take 1 tablet by mouth daily.         . meloxicam (MOBIC) 15 MG tablet   Oral   Take 15 mg by mouth daily.         Marland Kitchen oxyCODONE-acetaminophen (PERCOCET) 7.5-325 MG per tablet   Oral   Take 1 tablet by mouth every 4 (four) hours as needed for pain.         . pravastatin (PRAVACHOL) 80 MG tablet   Oral   Take 80 mg by mouth daily.         Marland Kitchen rOPINIRole (REQUIP) 0.5 MG tablet   Oral   Take 0.5-1 mg by mouth at bedtime as needed (Restless leg syndrome).         Marland Kitchen spironolactone-hydrochlorothiazide (ALDACTAZIDE) 25-25 MG per tablet   Oral   Take 1 tablet by mouth daily.          BP 104/56  Pulse 66  Temp(Src) 97.7 F (36.5 C) (Oral)  Resp 16  SpO2 98% Physical Exam  Nursing note and vitals reviewed. Constitutional: She is oriented to person, place, and time. She appears well-developed and well-nourished. No distress.  HENT:  Head: Normocephalic and atraumatic.  Eyes: Pupils are equal, round, and reactive  to light.  Neck: Normal range of motion. Neck supple.  Cardiovascular: Normal rate and regular rhythm.   Pulmonary/Chest: Effort normal.  Abdominal: Soft.  Musculoskeletal:       Left knee: She exhibits decreased range of motion, swelling, effusion and deformity. She exhibits no laceration. Tenderness (diffuse) found. Patellar tendon tenderness noted.       Legs: Patients knee is externally rotated. Pedal pulse is strong Cap refill is < 3 seconds  Neurological: She is alert and oriented to person, place, and time. No cranial nerve deficit.  Skin: Skin is warm and dry.    ED Course  Procedures (including critical care time) Labs Review Labs Reviewed  CBC WITH DIFFERENTIAL - Abnormal; Notable for the following:    WBC 13.4 (*)    RBC 5.27 (*)    Neutrophils Relative % 85 (*)    Neutro Abs 11.3 (*)      Lymphocytes Relative 8 (*)    All other components within normal limits  BASIC METABOLIC PANEL   Imaging Review Dg Chest 1 View  10/10/2013   CLINICAL DATA:  Fall.  Leg pain.  Preoperative evaluation.  EXAM: CHEST - 1 VIEW  COMPARISON:  09/20/2009  FINDINGS: There is a poor inspiration. Heart size is normal. There is aortic calcification. The lungs are clear. No effusions. No bony finding.  IMPRESSION: Poor inspiration.  No active disease.   Electronically Signed   By: Paulina Fusi M.D.   On: 10/10/2013 11:35   Dg Femur Left  10/10/2013   CLINICAL DATA:  Fall.  EXAM: LEFT FEMUR - 2 VIEW  COMPARISON:  None.  FINDINGS: Spiral fracture the mid to distal femur with mild displacement. There is mild foreshortening of the femur. The fracture does not appear to extend into the knee joint. The hip joint is normal.  IMPRESSION: Spiral fracture mid to distal femur.   Electronically Signed   By: Marlan Palau M.D.   On: 10/10/2013 11:32   Dg Knee 1-2 Views Left  10/10/2013   CLINICAL DATA:  Fall  EXAM: LEFT KNEE - 1-2 VIEW  COMPARISON:  Left femur from today  FINDINGS: Spiral fracture of the distal femur does not appear to extend into the knee joint. Suboptimal positioning of the knee on the AP view. No significant degenerative change. Normal alignment.  IMPRESSION: Spiral fracture distal femur does not appear to extend into the knee joint.   Electronically Signed   By: Marlan Palau M.D.   On: 10/10/2013 11:29    EKG Interpretation    Date/Time:  Friday October 10 2013 12:10:06 EST Ventricular Rate:  68 PR Interval:  192 QRS Duration: 92 QT Interval:  541 QTC Calculation: 575 R Axis:   75 Text Interpretation:  Sinus rhythm RSR' in V1 or V2, right VCD or RVH Nonspecific T abnormalities, anterior leads Prolonged QT interval No significant change since last tracing Confirmed by YAO  MD, DAVID [phone]) on 10/10/2013 12:20:08 PM            MDM   1. Nondisplaced spiral fracture of shaft of femur     Patient has sustained femur fracture Dr. Silverio Lay has seen patient has well. Good Pedal pulses, good ankle extension and flexion.  Spoke with Systems developer, PA-C who works with Dr. Victorino Dike. Pt to be placed in knee immoblizer, will be transferred to [facility] and will most likely have surgery this evening. She has been NPO since this morning. d EMTALA completed and temp admit orders complete.  Family and patient have been updated and their questions/concerns answered.  Dr. Angelica PouHewitts PA, Lorin PicketScott saw pt in the ER and has put in surgical orders for admission.      Dorthula Matasiffany G Helene Bernstein, PA-C 10/10/13 1233

## 2013-10-10 NOTE — Anesthesia Procedure Notes (Signed)
Procedure Name: Intubation Date/Time: 10/10/2013 7:39 PM Performed by: Molli HazardGORDON, Marjarie Irion M Pre-anesthesia Checklist: Patient identified, Emergency Drugs available, Suction available and Patient being monitored Patient Re-evaluated:Patient Re-evaluated prior to inductionOxygen Delivery Method: Circle system utilized Preoxygenation: Pre-oxygenation with 100% oxygen Intubation Type: IV induction Ventilation: Mask ventilation without difficulty Laryngoscope Size: Miller and 2 Grade View: Grade I Tube type: Oral Tube size: 7.5 mm Number of attempts: 1 Airway Equipment and Method: Stylet Placement Confirmation: ETT inserted through vocal cords under direct vision and positive ETCO2 Secured at: 21 cm Tube secured with: Tape Dental Injury: Teeth and Oropharynx as per pre-operative assessment

## 2013-10-10 NOTE — ED Notes (Signed)
Bed: WA21 Expected date:  Expected time:  Means of arrival:  Comments: ems- female, fall, possible knee dislocation or break

## 2013-10-10 NOTE — H&P (Signed)
Brooke Mccarthy is an 58 y.o. female.   Chief Complaint: Left thigh pain  HPI: 58 y/o female with PMHx of chronic back pain presented to the [place] ER s/p fall.  Pt stated she was going out to her back porch this AM when she slipped on ice and fell.  She heard a pop and then called 911.  Pain is located at the anterior left thigh deep to skin and radiates distally.  Worse with motion and better with rest.  Pt denies N/V/F/C, chest pain, SOB, calf pain or paresthesia bilaterally.  Past Medical History  Diagnosis Date  . PONV (postoperative nausea and vomiting)   . Hypothyroidism   . Hyperlipidemia   . History of gallstones   . Chronic back pain   . Anxiety   . Hypertension   . FH: restless leg syndrome   . Arthritis   . GERD (gastroesophageal reflux disease)     Past Surgical History  Procedure Laterality Date  . Back surgery    . 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 Bilateral foot surgery      Family History  Problem Relation Age of Onset  . Cancer Mother   . Cancer Father    Social History:  reports that she has been smoking.  She has never used smokeless tobacco. She reports that she does not drink alcohol or use illicit drugs.  Allergies:  Allergies  Allergen Reactions  . Ultram [Tramadol]     "makes my heart beat fast"     (Not in a hospital admission)  Results for orders placed during the hospital encounter of 10/10/13 (from the past 48 hour(s))  CBC WITH DIFFERENTIAL     Status: Abnormal   Collection Time    10/10/13 11:49 AM      Result Value Range   WBC 13.4 (*) 4.0 - 10.5 K/uL   RBC 5.27 (*) 3.87 - 5.11 MIL/uL   Hemoglobin 14.5  12.0 - 15.0 g/dL   HCT 43.3  36.0 - 46.0 %   MCV 82.2  78.0 - 100.0 fL   MCH 27.5  26.0 - 34.0 pg   MCHC 33.5  30.0 - 36.0 g/dL   RDW 13.5  11.5 - 15.5 %   Platelets 314  150 - 400 K/uL   Neutrophils Relative % 85 (*) 43 - 77 %   Neutro Abs 11.3 (*) 1.7 - 7.7 K/uL   Lymphocytes Relative 8 (*) 12 - 46 %   Lymphs  Abs 1.1  0.7 - 4.0 K/uL   Monocytes Relative 7  3 - 12 %   Monocytes Absolute 0.9  0.1 - 1.0 K/uL   Eosinophils Relative 0  0 - 5 %   Eosinophils Absolute 0.0  0.0 - 0.7 K/uL   Basophils Relative 0  0 - 1 %   Basophils Absolute 0.0  0.0 - 0.1 K/uL  BASIC METABOLIC PANEL     Status: Abnormal   Collection Time    10/10/13 11:49 AM      Result Value Range   Sodium 139  137 - 147 mEq/L   Potassium 4.9  3.7 - 5.3 mEq/L   Chloride 99  96 - 112 mEq/L   CO2 26  19 - 32 mEq/L   Glucose, Bld 135 (*) 70 - 99 mg/dL   BUN 16  6 - 23 mg/dL   Creatinine, Ser 0.91  0.50 - 1.10 mg/dL   Calcium 9.8  8.4 - 10.5 mg/dL  GFR calc non Af Amer 68 (*) >90 mL/min   GFR calc Af Amer 78 (*) >90 mL/min   Comment: (NOTE)     The eGFR has been calculated using the CKD EPI equation.     This calculation has not been validated in all clinical situations.     eGFR's persistently <90 mL/min signify possible Chronic Kidney     Disease.   Dg Chest 1 View  10/10/2013   CLINICAL DATA:  Fall.  Leg pain.  Preoperative evaluation.  EXAM: CHEST - 1 VIEW  COMPARISON:  09/20/2009  FINDINGS: There is a poor inspiration. Heart size is normal. There is aortic calcification. The lungs are clear. No effusions. No bony finding.  IMPRESSION: Poor inspiration.  No active disease.   Electronically Signed   By: Nelson Chimes M.D.   On: 10/10/2013 11:35   Dg Femur Left  10/10/2013   CLINICAL DATA:  Fall.  EXAM: LEFT FEMUR - 2 VIEW  COMPARISON:  None.  FINDINGS: Spiral fracture the mid to distal femur with mild displacement. There is mild foreshortening of the femur. The fracture does not appear to extend into the knee joint. The hip joint is normal.  IMPRESSION: Spiral fracture mid to distal femur.   Electronically Signed   By: Franchot Gallo M.D.   On: 10/10/2013 11:32   Dg Knee 1-2 Views Left  10/10/2013   CLINICAL DATA:  Fall  EXAM: LEFT KNEE - 1-2 VIEW  COMPARISON:  Left femur from today  FINDINGS: Spiral fracture of the distal femur  does not appear to extend into the knee joint. Suboptimal positioning of the knee on the AP view. No significant degenerative change. Normal alignment.  IMPRESSION: Spiral fracture distal femur does not appear to extend into the knee joint.   Electronically Signed   By: Franchot Gallo M.D.   On: 10/10/2013 11:29    Review of Systems  Constitutional: Negative.   HENT: Negative.   Eyes: Negative.   Respiratory: Negative.   Cardiovascular: Negative.   Gastrointestinal: Negative.   Musculoskeletal: Positive for back pain and falls. Negative for joint pain, myalgias and neck pain.  Skin: Negative.   Neurological: Negative.   Endo/Heme/Allergies: Negative.   Psychiatric/Behavioral: The patient is not nervous/anxious.     Blood pressure 104/56, pulse 66, temperature 97.7 F (36.5 C), temperature source Oral, resp. rate 16, SpO2 98.00%. Physical Exam  WD WN female in NAD, A/Ox3, appears stated age. EOMI, mood and affect normal, respirations unlabored.   Pt presents in knee immobilizer.  Removed for exam.  Skin healthy and intact with no ecchymosis and mild edema noted.  Flexion/extension of left knee recreates pain and was minimal.  +TTP to anterior left thigh.  Good ROM of left ankle, 5/5 strength with DF, PF, inversion and eversion of the ankle joint.  Distal toes mobile and well perfused with cap refill <2secs.  Normal sensation to light touch intact throughout LLE. No lymphadenoapthy.    Assessment/Plan Left femoral shaft fracture - to OR for retrograde IM nail.    I had detailed discussion with pt regarding proper treatment of her left femur fracture.  I discussed surgical and non-surgical options.  Pt has elected surgical option for treatment.  Pt will be transferred to [facility] and admitted under Dr. Doran Durand.  Pt will be placed in 5lbs of buck's traction, remain NPO, and prepped for surgery this evening for IM nailing of left femur.  Pt agrees with my assessment and plan, all  questions and  concerns were addressed during the exam today.  The risks and benefits of the alternative treatment options have been discussed in detail.  The patient wishes to proceed with surgery and specifically understands risks of bleeding, infection, nerve damage, blood clots, need for additional surgery, amputation and death.    FLOWERS, CHRISTOPHER S 10/10/2013, 12:43 PM  I have seen and examined the patient personally.  I agree with the note above.  The risks and benefits of the alternative treatment options have been discussed in detail.  The patient wishes to proceed with surgery and specifically understands risks of bleeding, infection, nerve damage, blood clots, need for additional surgery, amputation and death.

## 2013-10-10 NOTE — Preoperative (Signed)
Beta Blockers   Reason not to administer Beta Blockers:Not Applicable 

## 2013-10-11 LAB — CBC
HEMATOCRIT: 38.1 % (ref 36.0–46.0)
Hemoglobin: 12.7 g/dL (ref 12.0–15.0)
MCH: 27.9 pg (ref 26.0–34.0)
MCHC: 33.3 g/dL (ref 30.0–36.0)
MCV: 83.7 fL (ref 78.0–100.0)
PLATELETS: 252 10*3/uL (ref 150–400)
RBC: 4.55 MIL/uL (ref 3.87–5.11)
RDW: 13.7 % (ref 11.5–15.5)
WBC: 15 10*3/uL — AB (ref 4.0–10.5)

## 2013-10-11 MED ORDER — OXYCODONE HCL 5 MG PO TABS: 5.0000 mg | ORAL_TABLET | ORAL | Status: DC | PRN

## 2013-10-11 MED ORDER — RIVAROXABAN 10 MG PO TABS: 10.0000 mg | ORAL_TABLET | Freq: Every day | ORAL | Status: DC

## 2013-10-11 MED ORDER — HYDROCHLOROTHIAZIDE 25 MG PO TABS
25.0000 mg | ORAL_TABLET | Freq: Every day | ORAL | Status: DC
  Administered 2013-10-11: 25 mg via ORAL
  Filled 2013-10-11: qty 1

## 2013-10-11 MED ORDER — SPIRONOLACTONE 25 MG PO TABS
25.0000 mg | ORAL_TABLET | Freq: Every day | ORAL | Status: DC
  Administered 2013-10-11: 25 mg via ORAL
  Filled 2013-10-11: qty 1

## 2013-10-11 NOTE — Discharge Summary (Signed)
Physician Discharge Summary  Patient ID: Brooke Mccarthy MRN: [id number] DOB/AGE: 1956-06-29 58 y.o.  Admit date: 10/10/2013 Discharge date: 10/11/2013  Admission Diagnoses:  Chronic pain, htn, left femur fracture, osteopenia  Discharge Diagnoses:  Active Problems:   Displaced spiral fracture of shaft of femur   Femur fracture, left s/p IM nail of left femur  Discharged Condition: stable  Hospital Course: Pt was admitted and taken to the OR for IM nailing of her left femur.  She did well and remained on 5N for her hospital stay.  She was cleared by PT and was discharged home in stable condition.  Consults: PT and OT  Significant Diagnostic Studies: none  Treatments: therapies: PT and OT  Discharge Exam: Blood pressure 124/66, pulse 60, temperature 97.9 F (36.6 C), temperature source Oral, resp. rate 16, SpO2 97.00%. L LE dressed ad dry with knee immobilizer.  NVI at left foot.  Disposition: 01-Home or Self Care  Discharge Orders   Future Orders Complete By Expires   Call MD / Call 911  As directed    Comments:     If you experience chest pain or shortness of breath, CALL 911 and be transported to the hospital emergency room.  If you develope a fever above 101 F, pus (white drainage) or increased drainage or redness at the wound, or calf pain, call your surgeon's office.   Constipation Prevention  As directed    Comments:     Drink plenty of fluids.  Prune juice may be helpful.  You may use a stool softener, such as Colace (over the counter) 100 mg twice a day.  Use MiraLax (over the counter) for constipation as needed.   Diet - low sodium heart healthy  As directed    Increase activity slowly as tolerated  As directed        Medication List         ALPRAZolam 1 MG tablet  Commonly known as:  XANAX  Take 1-2 tablets by mouth 2 (two) times daily.     diltiazem 240 MG 24 hr capsule  Commonly known as:  DILACOR XR  Take 240 mg by mouth daily.     levothyroxine 100  MCG tablet  Commonly known as:  SYNTHROID, LEVOTHROID  Take 1 tablet by mouth daily.     meloxicam 15 MG tablet  Commonly known as:  MOBIC  Take 15 mg by mouth daily.     oxyCODONE 5 MG immediate release tablet  Commonly known as:  Oxy IR/ROXICODONE  Take 1 tablet (5 mg total) by mouth every 4 (four) hours as needed for severe pain (moderate to severe pain).     oxyCODONE-acetaminophen 7.5-325 MG per tablet  Commonly known as:  PERCOCET  Take 1 tablet by mouth every 4 (four) hours as needed for pain.     pravastatin 80 MG tablet  Commonly known as:  PRAVACHOL  Take 80 mg by mouth daily.     rivaroxaban 10 MG Tabs tablet  Commonly known as:  XARELTO  Take 1 tablet (10 mg total) by mouth daily.     rOPINIRole 0.5 MG tablet  Commonly known as:  REQUIP  Take 0.5-1 mg by mouth at bedtime as needed (Restless leg syndrome).     spironolactone-hydrochlorothiazide 25-25 MG per tablet  Commonly known as:  ALDACTAZIDE  Take 1 tablet by mouth daily.           Follow-up Information   Follow up with Toni Arthurs, MD. Schedule an  appointment as soon as possible for a visit in 2 weeks.   Specialty:  Orthopedic Surgery   Contact information:   [address] [place] [address] [phone]       Signed: Toni ArthursHEWITT, Crue Otero 10/11/2013, 8:41 AM

## 2013-10-11 NOTE — Op Note (Signed)
Brooke Mccarthy, Brooke Mccarthy              ACCOUNT NO.:  [id number]  MEDICAL RECORD NO.:  [id number]  LOCATION:  5N22C                        FACILITY:  [facility]  PHYSICIAN:  Brooke Arthurs, MD        DATE OF BIRTH:  08-30-1956  DATE OF PROCEDURE:  10/10/2013 DATE OF DISCHARGE:                              OPERATIVE REPORT   PREOPERATIVE DIAGNOSIS:  Left femoral shaft fracture.  POSTOPERATIVE DIAGNOSIS:  Left femoral shaft fracture.  PROCEDURE:  Retrograde intramedullary nailing of the left femoral shaft fracture.  SURGEON:  Brooke Arthurs, MD.  ASSISTANTLorin Picket Flowers, PA-C.  ANESTHESIA:  General.  ESTIMATED BLOOD LOSS:  250 mL.  TOURNIQUET TIME:  Zero.  COMPLICATIONS:  None.  DISPOSITION:  Extubated, awake, and stable to recovery.  INDICATIONS FOR PROCEDURE:  The patient is a 58 year old woman without significant past medical history, who fell on her deck this morning when she slipped on some ice.  She was brought to the emergency department by EMS.  The x-rays revealed a left femoral shaft fracture.  She presents now for operative treatment of this unstable displaced femoral shaft fracture.  She understands the risks and benefits, the alternative treatment options and elects surgical treatment.  She specifically understands risks of bleeding, infection, nerve damage, blood clots, need for additional surgery, failure to heal, chronic pain, amputation and death.  PROCEDURE IN DETAIL:  After preoperative consent was obtained, the correct operative site was identified.  The patient was brought to the operating room and placed supine on the operating table.  General anesthesia was induced.  Preoperative antibiotics were administered. Surgical time-out was taken.  The left lower extremity was prepped and draped in standard sterile fashion.  A longitudinal incision was made from the inferior pole of the patella to the tibial tubercle in line with the patellar tendon.  A longitudinal  incision was then made through the retinaculum adjacent to the medial border of the patellar tendon. The tendon was retracted laterally.  Blunt dissection was then carried down through the fat pad into the knee joint.  The guide pin was inserted into the intercondylar notch just anterior to the origin of the PCL.  AP and lateral radiographs confirmed appropriate positioning of the guide pin, was then advanced into the femoral canal.  The opening reamer was used, advanced over the guide pin to the appropriate depth. The guide pin was removed.  The fracture site was then visualized in the AP and lateral planes.  The longitudinal traction was applied followed by internal external rotation.  The reduction was unsatisfactory with closed reduction techniques.  A small longitudinal incision was made laterally adjacent to the fracture site.  Blunt dissection was carried down through the skin and subcutaneous tissue to the fracture site.  A lobster claw was placed through this incision and subperiosteally across the fracture site.  Again, fracture reduction was performed and the fracture was clamped and held in an appropriately reduced position.  AP and lateral radiographic images confirmed appropriate reduction of the fracture.  A ball-tip guidewire was then passed across the fracture site to the level just proximal to the lesser trochanter.  The femoral canal was then sequentially reamed  to a diameter of 10.5 mm.  A 9 mm x 320 mm [employer] [place] nail was selected.  It was inserted through the knee across the fracture site to the level of the lesser trochanter.  The guidewire was removed.  The nail was appropriately seated, slightly countersunk to the level of the intercondylar notch.  The targeting guide was then used to insert 3 interlocking screws through the distal end of the femur and the nail.  Two were oblique and 1 was transverse. AP and lateral radiographs confirmed appropriate length of  all 3 screws. The nail was then locked to the screws through the distal end.  The targeting guide was removed.  The extremity was then manipulated to impact the fracture site.  The perfect circle technique was then used to insert a proximal interlock screw through the static hole in the nail percutaneously.  AP and lateral radiographs at the knee, fracture site and proximal end of the nail showed appropriate position and length of all hardware and appropriate reduction of the fracture.  All wounds were then irrigated copiously.  The knee joint was then irrigated copiously. Fat pad was repaired followed by the retinaculum with simple sutures of 0 Vicryl.  Subcutaneous tissue was approximated with inverted simple sutures of 2-0 Vicryl and a running 3-0 nylon was used to close the skin incision.  The remaining incisions were all closed with horizontal mattress sutures of 3-0 nylon.  Sterile dressings were applied followed by compression wrap and a knee immobilizer.  The patient was then awakened from anesthesia and transported to the recovery room in stable condition.  FOLLOWUP PLAN:  The patient will be toe-touch weightbearing on the left lower extremity.  She will be observed overnight for pain control.  She will have physical therapy, occupational therapy, and case management consults tomorrow.  She will be started on Lovenox 40 mg p.o. daily beginning tomorrow for DVT prophylaxis.  Scott Flowers PA-C was present and scrubbed for the duration of the case.  His assistance was essentially for gaining and maintaining exposure, reducing the fracture, inserting the nail, in closing and dressing the wounds.     Brooke ArthursJohn Nakeeta Sebastiani, MD     JH/MEDQ  D:  10/10/2013  T:  10/11/2013  Job:  [id number]

## 2013-10-11 NOTE — Progress Notes (Signed)
Subjective: 1 Day Post-Op Procedure(s) (LRB): INTRAMEDULLARY (IM) RETROGRADE FEMORAL NAILING (Left) Patient reports pain as mild.  Controlled with oral pain meds.  Pt takes percocet at home for chronic pain.  Objective: Vital signs in last 24 hours: Temp:  [97.7 F (36.5 C)-99.6 F (37.6 C)] 97.9 F (36.6 C) (01/17 0535) Pulse Rate:  [58-88] 60 (01/17 0535) Resp:  [13-21] 16 (01/17 0535) BP: (100-176)/(53-92) 124/66 mmHg (01/17 0535) SpO2:  [93 %-99 %] 97 % (01/17 0535) FiO2 (%):  [28 %] 28 % (01/16 2250)  Intake/Output from previous day: 01/16 0701 - 01/17 0700 In: 2496.7 [P.O.:720; I.V.:1776.7] Out: 650 [Urine:400; Blood:250] Intake/Output this shift:     Recent Labs  10/10/13 1149 10/10/13 2014  HGB 14.5 12.7    Recent Labs  10/10/13 1149 10/10/13 2014  WBC 13.4* 15.0*  RBC 5.27* 4.55  HCT 43.3 38.1  PLT 314 252    Recent Labs  10/10/13 1149  NA 139  K 4.9  CL 99  CO2 26  BUN 16  CREATININE 0.91  GLUCOSE 135*  CALCIUM 9.8   No results found for this basename: LABPT, INR,  in the last 72 hours  PE:  L thigh wounds dressed and dry.  Sens to LT intact at the calf and foot.  5/5 strength in PF adn DF of the ankle.  2+ dp and pt pulses.  Assessment/Plan: 1 Day Post-Op Procedure(s) (LRB): INTRAMEDULLARY (IM) RETROGRADE FEMORAL NAILING (Left) Up with therapy  Pt may be discharged home today if she does well with PT.  Seth Higginbotham 10/11/2013, 8:28 AM

## 2013-10-11 NOTE — Evaluation (Signed)
Physical Therapy Evaluation and Discharge Patient Details Name: Brooke Mccarthy MRN: [id number] DOB: 1956-06-09 Today's Date: 10/11/2013 Time: 2130-8657 PT Time Calculation (min): 24 min  PT Assessment / Plan / Recommendation History of Present Illness  L spiral femur fracture. ORIF. TDWB. KI when walking. Otherwise for comfort.  Clinical Impression  Patient evaluated by Physical Therapy with no further acute PT needs identified. All education has been completed and the patient has no further questions.  See below for any follow-up Physial Therapy or equipment needs. PT is signing off. Thank you for this referral.     PT Assessment  Patent does not need any further PT services    Follow Up Recommendations  No PT follow up    Does the patient have the potential to tolerate intense rehabilitation      Barriers to Discharge        Equipment Recommendations  Rolling walker with 5" wheels    Recommendations for Other Services     Frequency      Precautions / Restrictions Precautions Precautions: Fall Precaution Comments: KI when walking Required Braces or Orthoses: Knee Immobilizer - Left Knee Immobilizer - Left: On when out of bed or walking Restrictions Weight Bearing Restrictions: Yes LLE Weight Bearing: Touchdown weight bearing   Pertinent Vitals/Pain Lt thigh pain 4/10; patient repositioned for comfort       Mobility  Bed Mobility Overal bed mobility: Modified Independent Transfers Overall transfer level: Needs assistance Equipment used: Rolling walker (2 wheeled) Transfers: Sit to/from Stand Sit to Stand: Supervision;Modified independent (Device/Increase time) General transfer comment: supervision with cues for safe hand placement/sequencing with RW; advanced to modified indep  Ambulation/Gait Ambulation/Gait assistance: Supervision;Modified independent (Device/Increase time) Ambulation Distance (Feet): 20 Feet Assistive device: Rolling walker (2  wheeled) Gait Pattern/deviations: Step-to pattern General Gait Details: initially maintaining NWB on LLE; instructed in TDWB and benefits Stairs: Yes Stairs assistance: Min assist Stair Management: Two rails;Step to pattern;Forwards Number of Stairs: 2 General stair comments: Demonstrated options of up stairs backwards with RW, up forwards with one rail and RW folded and then with RW open. Pt reports she'll have room to open RW and use one rail (practice steps not wide enough for this, therefore allowed pt to use 2 rails). She also states she thinks her family will "just carry me up."    Exercises     PT Diagnosis:    PT Problem List:   PT Treatment Interventions:       PT Goals(Current goals can be found in the care plan section) Acute Rehab PT Goals PT Goal Formulation: No goals set, d/c therapy  Visit Information  Last PT Received On: 10/11/13 Assistance Needed: +1 Reason Eval/Treat Not Completed: Other (comment) History of Present Illness: L spiral femur fracture. ORIF. TDWB. KI when walking. Otherwise for comfort.       Prior Functioning  Home Living Family/patient expects to be discharged to:: Private residence Living Arrangements: Alone;Children Available Help at Discharge: Available 24 hours/day;Family Type of Home: House Home Access: Stairs to enter [employer] of Steps: 4-5 Entrance Stairs-Rails: Right;Left Home Layout: One level Home Equipment: None Prior Function Level of Independence: Independent Communication Communication: No difficulties Dominant Hand: Right    Cognition  Cognition Arousal/Alertness: Awake/alert Behavior During Therapy: WFL for tasks assessed/performed Overall Cognitive Status: Within Functional Limits for tasks assessed    Extremity/Trunk Assessment Upper Extremity Assessment Upper Extremity Assessment: Overall WFL for tasks assessed Lower Extremity Assessment Lower Extremity Assessment: LLE deficits/detail LLE  Deficits /  Details: in Knee immobilizer; ankle WFL; able to flex hip >120; able to hold foot up in NWB Cervical / Trunk Assessment Cervical / Trunk Assessment: Normal   Balance General Comments General comments (skin integrity, edema, etc.): good safety awareness; slipped on ice causing fall and fracture; educated on application of KI and indications for using  End of Session PT - End of Session Equipment Utilized During Treatment: Gait belt;Left knee immobilizer Activity Tolerance: Patient tolerated treatment well Patient left: in chair;Other (comment) (in gym with OT for education) Nurse Communication: Mobility status;Other (comment) (OK for d/c from PT standpoint)  GP     Brooke Mccarthy 10/11/2013, 12:16 PM Pager [phone]

## 2013-10-11 NOTE — Discharge Instructions (Signed)
Toni ArthursJohn Zissy Hamlett, MD [facility]  Please read the following information regarding your care after surgery.  Medications  You only need a prescription for the narcotic pain medicine (ex. oxycodone, Percocet, Norco).  All of the other medicines listed below are available over the counter. X oxycodone as prescribed for moderate to severe pain X xarelto every day for two weeks to prevent blood clots.  Narcotic pain medicine (ex. oxycodone, Percocet, Vicodin) will cause constipation.  To prevent this problem, take the following medicines while you are taking any pain medicine. X docusate sodium (Colace) 100 mg twice a day X senna (Senokot) 2 tablets twice a day  X After finishing the Xarelto, take an aspirin (325 mg) once a day for a month.  You should also get up every hour while you are awake to move around.    Weight Bearing Toe touch weight bearing on your left leg.  Cast / Splint / Dressing X Remove your dressing 3 days after surgery and cover the incisions with dry dressings or band aids.  After your dressing, cast or splint is removed; you may shower, but do not soak or scrub the wound.  Allow the water to run over it, and then gently pat it dry.  Swelling It is normal for you to have swelling where you had surgery.  To reduce swelling and pain, keep your toes above your nose for at least 3 days after surgery.  It may be necessary to keep your foot or leg elevated for several weeks.  If it hurts, it should be elevated.  Follow Up Call my office at [phone] when you are discharged from the hospital or surgery center to schedule an appointment to be seen two weeks after surgery.  Call my office at [phone] if you develop a fever >101.5 F, nausea, vomiting, bleeding from the surgical site or severe pain.

## 2013-10-11 NOTE — Progress Notes (Signed)
PT Cancellation Note  Patient Details Name: Brooke Mccarthy MRN: [id number] DOB: 07-Nov-1955   Cancelled Treatment:    Reason Eval/Treat Not Completed: MD did not indicate weight-bearing status. Also noted mention of knee immobilizer in his note, however there is no order clarifying when it is to be used and if OK to remove for ROM. Spoke with RN and she will contact MD to get clarification.   Emmanual Gauthreaux 10/11/2013, 9:35 AM Pager [phone]

## 2013-10-11 NOTE — Progress Notes (Signed)
Occupational Therapy Evaluation Patient Details Name: Brooke Mccarthy MRN: [id number] DOB: [birth year] Today's Date: 10/11/2013 Time: [id number] OT Time Calculation (min): 32 min  OT Assessment / Plan / Recommendation History of present illness L spiral femur fracture. ORIF. TDWB. KI when walking. Otherwise for comfort.   Clinical Impression   Pt making excellent progress. Will see again this am to complete education.    OT Assessment  Patient needs continued OT Services    Follow Up Recommendations  No OT follow up    Barriers to Discharge      Equipment Recommendations  3 in 1 bedside comode    Recommendations for Other Services    Frequency  Min 2X/week    Precautions / Restrictions Precautions Precautions: Fall Precaution Comments: KI when walking Required Braces or Orthoses: Knee Immobilizer - Left Knee Immobilizer - Left: On when out of bed or walking Restrictions Weight Bearing Restrictions: Yes LLE Weight Bearing: Touchdown weight bearing   Pertinent Vitals/Pain 5/10. LLE    ADL  Grooming: Supervision/safety;Set up Where Assessed - Grooming: Supported standing Upper Body Bathing: Set up Where Assessed - Upper Body Bathing: Unsupported sitting Lower Body Bathing: Minimal assistance Where Assessed - Lower Body Bathing: Unsupported sit to stand Upper Body Dressing: Set up Where Assessed - Upper Body Dressing: Unsupported sitting Lower Body Dressing: Minimal assistance Where Assessed - Lower Body Dressing: Unsupported sit to stand Toilet Transfer: Min guard Toilet Transfer Method: Other (comment) (ambulating) AcupuncturistToilet Transfer Equipment: Comfort height toilet Toileting - Clothing Manipulation and Hygiene: Supervision/safety Where Assessed - Engineer, miningToileting Clothing Manipulation and Hygiene: Sit to stand from 3-in-1 or toilet Equipment Used: Gait belt;Knee Immobilizer;Rolling walker Transfers/Ambulation Related to ADLs: minguard ADL Comments: Educated pt/family on  compensatory techniques with ADL/precautions    OT Diagnosis: Generalized weakness;Acute pain  OT Problem List: Decreased strength;Decreased range of motion;Decreased knowledge of use of DME or AE;Decreased knowledge of precautions;Pain OT Treatment Interventions: Self-care/ADL training;DME and/or AE instruction;Therapeutic activities;Patient/family education   OT Goals(Current goals can be found in the care plan section) Acute Rehab OT Goals Patient Stated Goal: go home OT Goal Formulation: With patient Time For Goal Achievement: 10/18/13 Potential to Achieve Goals: Good  Visit Information  Last OT Received On: 10/11/13 Assistance Needed: +1 History of Present Illness: L spiral femur fracture. ORIF. TDWB. KI when walking. Otherwise for comfort.       Prior Functioning     Home Living Family/patient expects to be discharged to:: Private residence Living Arrangements: Alone;Children Available Help at Discharge: Available 24 hours/day;Family Type of Home: House Home Access: Stairs to enter Entrance Stairs-Number of Steps: 4-5 Entrance Stairs-Rails: Right;Left Home Layout: One level Home Equipment: None Prior Function Level of Independence: Independent Communication Communication: No difficulties Dominant Hand: Right         Vision/Perception Vision - History Baseline Vision: Wears glasses only for reading   Cognition  Cognition Arousal/Alertness: Awake/alert Behavior During Therapy: WFL for tasks assessed/performed Overall Cognitive Status: Within Functional Limits for tasks assessed    Extremity/Trunk Assessment Upper Extremity Assessment Upper Extremity Assessment: Overall WFL for tasks assessed Lower Extremity Assessment Lower Extremity Assessment: LLE deficits/detail LLE Deficits / Details: in Knee immobilizer; ankle WFL; able to flex hip >120; able to hold foot up in NWB Cervical / Trunk Assessment Cervical / Trunk Assessment: Normal     Mobility Bed  Mobility Overal bed mobility: Modified Independent Transfers Overall transfer level: Needs assistance Equipment used: Rolling walker (2 wheeled) Transfers: Sit to/from Stand Sit to Stand: Supervision;Modified independent (Device/Increase  time) General transfer comment: supervision with cues for safe hand placement/sequencing with RW; advanced to modified indep      Exercise     Balance Balance Overall balance assessment: Modified Independent General Comments General comments (skin integrity, edema, etc.): good safety awareness; slipped on ice causing fall and fracture; educated on application of KI and indications for using   End of Session OT - End of Session Equipment Utilized During Treatment: Gait belt;Rolling walker;Left knee immobilizer Activity Tolerance: Patient tolerated treatment well Patient left: in chair;with call bell/phone within reach;with family/visitor present Nurse Communication: Mobility status;Precautions;Weight bearing status  GO     Brooke Mccarthy,Brooke Mccarthy 10/11/2013, 1:02 PM [facility], OTR/L  [phone] 10/11/2013

## 2013-10-11 NOTE — Progress Notes (Signed)
Spoke with MD on call, patient to use knee immobilizer when up and for comfort, and will be Toe Touch weight bearing status.

## 2013-10-11 NOTE — Progress Notes (Signed)
Discharge instructions and prescriptions given and explained to patient. Patient denies questions or concerns. IV removed. Vital signs stable. Patient discharged via wheelchair with discharge packet, prescriptions, and personal belongings accompanied by family and NT.

## 2013-10-13 NOTE — ED Provider Notes (Signed)
Medical screening examination/treatment/procedure(s) were conducted as a shared visit with non-physician practitioner(s) and myself.  I personally evaluated the patient during the encounter.  EKG Interpretation    Date/Time:  Friday October 10 2013 12:10:06 EST Ventricular Rate:  68 PR Interval:  192 QRS Duration: 92 QT Interval:  541 QTC Calculation: 575 R Axis:   75 Text Interpretation:  Sinus rhythm RSR' in V1 or V2, right VCD or RVH Nonspecific T abnormalities, anterior leads Prolonged QT interval No significant change since last tracing Confirmed by [employer]unk[employer]  MD, Dejean Tribby [phone]) on 10/10/2013 12:20:08 PM            Brooke Mccarthy is a 58 y.o. female here with L knee pain. She stepped out the house and slipped and twisted L knee. There was an obvious deformity as per EMS. On exam, deformity on distal L femur. Unable to range L knee. There is L DP pulse. Xray showed distal femur fracture. Ortho consulted [facility].    Richardean Canalavid H Alyssia Heese, MD 10/13/13 [phone]

## 2013-10-13 NOTE — Progress Notes (Signed)
Utilization review completed.  

## 2013-10-13 NOTE — Care Management Note (Signed)
CARE MANAGEMENT NOTE 10/13/2013  Patient:  Brooke Mccarthy,Brooke Mccarthy   Account Number:  [id number]  Date Initiated:  10/13/2013  Documentation initiated by:  Vance PeperBRADY,Dejan Angert  Subjective/Objective Assessment:   58 yr old female s/p IM Nailing of left femoral shaft.     Action/Plan:   No HH needs identified.   Anticipated DC Date:  10/11/2013   Anticipated DC Plan:  HOME/SELF CARE         Choice offered to / List presented to:             Status of service:  Completed, signed off Medicare Important Message given?   (If response is "NO", the following Medicare IM given date fields will be blank) Date Medicare IM given:   Date Additional Medicare IM given:    Discharge Disposition:  HOME/SELF CARE

## 2013-10-14 ENCOUNTER — Encounter (HOSPITAL_COMMUNITY): Payer: Self-pay | Admitting: Orthopedic Surgery

## 2017-08-30 ENCOUNTER — Other Ambulatory Visit: Payer: Self-pay | Admitting: Family Medicine

## 2017-08-30 DIAGNOSIS — R1011 Right upper quadrant pain: Secondary | ICD-10-CM

## 2017-09-05 ENCOUNTER — Ambulatory Visit
Admission: RE | Admit: 2017-09-05 | Discharge: 2017-09-05 | Disposition: A | Discharge status: Home / Self Care | Payer: Medicare Other | Source: Ambulatory Visit | Attending: Family Medicine | Admitting: Family Medicine

## 2017-09-05 DIAGNOSIS — R1011 Right upper quadrant pain: Secondary | ICD-10-CM

## 2017-09-08 ENCOUNTER — Encounter (HOSPITAL_COMMUNITY): Payer: Self-pay

## 2017-09-08 ENCOUNTER — Inpatient Hospital Stay (HOSPITAL_COMMUNITY): Payer: Medicare Other

## 2017-09-08 ENCOUNTER — Other Ambulatory Visit: Payer: Self-pay

## 2017-09-08 ENCOUNTER — Inpatient Hospital Stay (HOSPITAL_COMMUNITY)
Admission: EM | Admit: 2017-09-08 | Discharge: 2017-09-12 | DRG: 418 | Disposition: A | Discharge status: Home / Self Care | Payer: Medicare Other | Attending: Internal Medicine | Admitting: Internal Medicine

## 2017-09-08 ENCOUNTER — Emergency Department (HOSPITAL_COMMUNITY): Payer: Medicare Other

## 2017-09-08 DIAGNOSIS — Z7901 Long term (current) use of anticoagulants: Secondary | ICD-10-CM

## 2017-09-08 DIAGNOSIS — R945 Abnormal results of liver function studies: Secondary | ICD-10-CM | POA: Diagnosis present

## 2017-09-08 DIAGNOSIS — D72829 Elevated white blood cell count, unspecified: Secondary | ICD-10-CM | POA: Diagnosis not present

## 2017-09-08 DIAGNOSIS — R112 Nausea with vomiting, unspecified: Secondary | ICD-10-CM

## 2017-09-08 DIAGNOSIS — K831 Obstruction of bile duct: Secondary | ICD-10-CM

## 2017-09-08 DIAGNOSIS — I10 Essential (primary) hypertension: Secondary | ICD-10-CM | POA: Diagnosis present

## 2017-09-08 DIAGNOSIS — K838 Other specified diseases of biliary tract: Secondary | ICD-10-CM | POA: Diagnosis present

## 2017-09-08 DIAGNOSIS — F419 Anxiety disorder, unspecified: Secondary | ICD-10-CM | POA: Diagnosis present

## 2017-09-08 DIAGNOSIS — R1011 Right upper quadrant pain: Secondary | ICD-10-CM

## 2017-09-08 DIAGNOSIS — K8050 Calculus of bile duct without cholangitis or cholecystitis without obstruction: Secondary | ICD-10-CM | POA: Diagnosis not present

## 2017-09-08 DIAGNOSIS — E871 Hypo-osmolality and hyponatremia: Secondary | ICD-10-CM | POA: Diagnosis present

## 2017-09-08 DIAGNOSIS — K8071 Calculus of gallbladder and bile duct without cholecystitis with obstruction: Secondary | ICD-10-CM

## 2017-09-08 DIAGNOSIS — F17210 Nicotine dependence, cigarettes, uncomplicated: Secondary | ICD-10-CM | POA: Diagnosis present

## 2017-09-08 DIAGNOSIS — R109 Unspecified abdominal pain: Secondary | ICD-10-CM

## 2017-09-08 DIAGNOSIS — E039 Hypothyroidism, unspecified: Secondary | ICD-10-CM | POA: Diagnosis present

## 2017-09-08 DIAGNOSIS — K8064 Calculus of gallbladder and bile duct with chronic cholecystitis without obstruction: Secondary | ICD-10-CM | POA: Diagnosis present

## 2017-09-08 DIAGNOSIS — K8070 Calculus of gallbladder and bile duct without cholecystitis without obstruction: Secondary | ICD-10-CM

## 2017-09-08 DIAGNOSIS — K219 Gastro-esophageal reflux disease without esophagitis: Secondary | ICD-10-CM | POA: Diagnosis present

## 2017-09-08 DIAGNOSIS — E876 Hypokalemia: Secondary | ICD-10-CM | POA: Diagnosis present

## 2017-09-08 DIAGNOSIS — E785 Hyperlipidemia, unspecified: Secondary | ICD-10-CM | POA: Diagnosis present

## 2017-09-08 DIAGNOSIS — Z79899 Other long term (current) drug therapy: Secondary | ICD-10-CM | POA: Diagnosis not present

## 2017-09-08 DIAGNOSIS — R7989 Other specified abnormal findings of blood chemistry: Secondary | ICD-10-CM

## 2017-09-08 LAB — URINALYSIS, ROUTINE W REFLEX MICROSCOPIC
BILIRUBIN URINE: NEGATIVE
Glucose, UA: NEGATIVE mg/dL
Ketones, ur: NEGATIVE mg/dL
Nitrite: NEGATIVE
Protein, ur: 30 mg/dL — AB
Specific Gravity, Urine: 1.018 (ref 1.005–1.030)
pH: 8 (ref 5.0–8.0)

## 2017-09-08 LAB — CBC
HEMATOCRIT: 40.6 % (ref 36.0–46.0)
Hemoglobin: 13.5 g/dL (ref 12.0–15.0)
MCH: 26.9 pg (ref 26.0–34.0)
MCHC: 33.3 g/dL (ref 30.0–36.0)
MCV: 81 fL (ref 78.0–100.0)
Platelets: 309 10*3/uL (ref 150–400)
RBC: 5.01 MIL/uL (ref 3.87–5.11)
RDW: 13.7 % (ref 11.5–15.5)
WBC: 13.3 10*3/uL — AB (ref 4.0–10.5)

## 2017-09-08 LAB — COMPREHENSIVE METABOLIC PANEL
ALBUMIN: 4 g/dL (ref 3.5–5.0)
ALT: 142 U/L — ABNORMAL HIGH (ref 14–54)
AST: 181 U/L — AB (ref 15–41)
Alkaline Phosphatase: 230 U/L — ABNORMAL HIGH (ref 38–126)
Anion gap: 11 (ref 5–15)
BUN: 15 mg/dL (ref 6–20)
CHLORIDE: 99 mmol/L — AB (ref 101–111)
CO2: 24 mmol/L (ref 22–32)
Calcium: 9.3 mg/dL (ref 8.9–10.3)
Creatinine, Ser: 1.25 mg/dL — ABNORMAL HIGH (ref 0.44–1.00)
GFR calc Af Amer: 52 mL/min — ABNORMAL LOW (ref >60)
GFR, EST NON AFRICAN AMERICAN: 45 mL/min — AB (ref >60)
Glucose, Bld: 152 mg/dL — ABNORMAL HIGH (ref 65–99)
POTASSIUM: 3.2 mmol/L — AB (ref 3.5–5.1)
SODIUM: 134 mmol/L — AB (ref 135–145)
Total Bilirubin: 0.9 mg/dL (ref 0.3–1.2)
Total Protein: 7.4 g/dL (ref 6.5–8.1)

## 2017-09-08 LAB — LIPASE, BLOOD: LIPASE: 44 U/L (ref 11–51)

## 2017-09-08 MED ORDER — ALPRAZOLAM 0.5 MG PO TABS
1.0000 mg | ORAL_TABLET | Freq: Two times a day (BID) | ORAL | Status: DC
  Administered 2017-09-09 – 2017-09-11 (×5): 1 mg via ORAL
  Filled 2017-09-08 (×7): qty 2

## 2017-09-08 MED ORDER — HYDROMORPHONE HCL 1 MG/ML IJ SOLN
1.0000 mg | INTRAMUSCULAR | Status: DC | PRN
Start: 2017-09-08 — End: 2017-09-12
  Administered 2017-09-08 – 2017-09-10 (×5): 1 mg via INTRAVENOUS
  Administered 2017-09-11 (×2): 0.5 mg via INTRAVENOUS
  Filled 2017-09-08 (×6): qty 1

## 2017-09-08 MED ORDER — HYDROMORPHONE HCL 1 MG/ML IJ SOLN
0.5000 mg | Freq: Once | INTRAMUSCULAR | Status: AC
  Administered 2017-09-08: 0.5 mg via INTRAVENOUS
  Filled 2017-09-08: qty 1

## 2017-09-08 MED ORDER — ONDANSETRON HCL 4 MG/2ML IJ SOLN
4.0000 mg | Freq: Four times a day (QID) | INTRAMUSCULAR | Status: DC | PRN
  Administered 2017-09-09: 4 mg via INTRAVENOUS
  Filled 2017-09-08: qty 2

## 2017-09-08 MED ORDER — BUPROPION HCL ER (XL) 150 MG PO TB24
300.0000 mg | ORAL_TABLET | Freq: Every day | ORAL | Status: DC
  Administered 2017-09-09 – 2017-09-12 (×3): 300 mg via ORAL
  Filled 2017-09-08 (×3): qty 2

## 2017-09-08 MED ORDER — DEXTROSE 5 % IV SOLN
1.0000 g | INTRAVENOUS | Status: DC
  Administered 2017-09-09 – 2017-09-11 (×4): 1 g via INTRAVENOUS
  Filled 2017-09-08 (×4): qty 10

## 2017-09-08 MED ORDER — AMLODIPINE BESYLATE 5 MG PO TABS: 5.0000 mg | ORAL_TABLET | Freq: Every day | ORAL | Status: DC

## 2017-09-08 MED ORDER — PANTOPRAZOLE SODIUM 40 MG PO TBEC
40.0000 mg | DELAYED_RELEASE_TABLET | Freq: Every day | ORAL | Status: DC
  Administered 2017-09-09: 40 mg via ORAL
  Filled 2017-09-08: qty 1

## 2017-09-08 MED ORDER — LEVOTHYROXINE SODIUM 100 MCG PO TABS
100.0000 ug | ORAL_TABLET | Freq: Every day | ORAL | Status: DC
  Administered 2017-09-09 – 2017-09-12 (×3): 100 ug via ORAL
  Filled 2017-09-08 (×3): qty 1

## 2017-09-08 MED ORDER — MELOXICAM 7.5 MG PO TABS
15.0000 mg | ORAL_TABLET | Freq: Every day | ORAL | Status: DC
  Administered 2017-09-09: 15 mg via ORAL
  Filled 2017-09-08: qty 2

## 2017-09-08 MED ORDER — SPIRONOLACTONE-HCTZ 25-25 MG PO TABS: 1.0000 | ORAL_TABLET | Freq: Every day | ORAL | Status: DC

## 2017-09-08 MED ORDER — SODIUM CHLORIDE 0.9 % IV BOLUS (SEPSIS)
1000.0000 mL | Freq: Once | INTRAVENOUS | Status: AC
  Administered 2017-09-08: 1000 mL via INTRAVENOUS

## 2017-09-08 MED ORDER — GADOBENATE DIMEGLUMINE 529 MG/ML IV SOLN
14.0000 mL | Freq: Once | INTRAVENOUS | Status: AC | PRN
  Administered 2017-09-09: 14 mL via INTRAVENOUS

## 2017-09-08 MED ORDER — POTASSIUM CHLORIDE IN NACL 20-0.9 MEQ/L-% IV SOLN
INTRAVENOUS | Status: DC
  Administered 2017-09-09: 01:00:00 via INTRAVENOUS
  Filled 2017-09-08 (×2): qty 1000

## 2017-09-08 MED ORDER — DILTIAZEM HCL ER COATED BEADS 240 MG PO CP24
240.0000 mg | ORAL_CAPSULE | Freq: Every day | ORAL | Status: DC
  Administered 2017-09-09 – 2017-09-12 (×3): 240 mg via ORAL
  Filled 2017-09-08: qty 1
  Filled 2017-09-08: qty 2
  Filled 2017-09-08: qty 1
  Filled 2017-09-08 (×2): qty 2
  Filled 2017-09-08: qty 1
  Filled 2017-09-08: qty 2
  Filled 2017-09-08: qty 1

## 2017-09-08 MED ORDER — SPIRONOLACTONE-HCTZ 25-25 MG PO TABS
1.0000 | ORAL_TABLET | Freq: Every day | ORAL | Status: DC
  Filled 2017-09-08: qty 1

## 2017-09-08 MED ORDER — FENTANYL CITRATE (PF) 100 MCG/2ML IJ SOLN
25.0000 ug | Freq: Once | INTRAMUSCULAR | Status: AC
  Administered 2017-09-08: 25 ug via INTRAVENOUS
  Filled 2017-09-08: qty 2

## 2017-09-08 MED ORDER — ACETAMINOPHEN 325 MG PO TABS: 650.0000 mg | ORAL_TABLET | Freq: Four times a day (QID) | ORAL | Status: DC | PRN

## 2017-09-08 MED ORDER — ACETAMINOPHEN 650 MG RE SUPP: 650.0000 mg | Freq: Four times a day (QID) | RECTAL | Status: DC | PRN

## 2017-09-08 MED ORDER — ALPRAZOLAM 0.5 MG PO TABS
1.0000 mg | ORAL_TABLET | Freq: Once | ORAL | Status: AC
  Administered 2017-09-08: 1 mg via ORAL
  Filled 2017-09-08: qty 2

## 2017-09-08 NOTE — ED Notes (Signed)
EDP at bedside  

## 2017-09-08 NOTE — ED Triage Notes (Signed)
Patient sent by Dr. Andrey CampanileWilson for [place] for gall stone in duct. Patient has been having increased epigastric pain intermittently since November. [facility], nausea and pain with same

## 2017-09-08 NOTE — ED Notes (Signed)
Pt transported to US

## 2017-09-08 NOTE — ED Notes (Signed)
Dr. Selena BattenKim with admitting at bedside.  Patient made aware of NPO status until MRI scan in 2 more hours.

## 2017-09-08 NOTE — ED Notes (Signed)
Attempted report x1. 

## 2017-09-08 NOTE — ED Provider Notes (Signed)
[facility] EMERGENCY DEPARTMENT Provider Note  CSN: [id number] Arrival date & time: 09/08/17 1609  Chief Complaint(s) sent by MD/gallstones  HPI Brooke Mccarthy is a 61 y.o. female with a history of GERD and known gallstones who had an ultrasound 3 days ago revealing a dilated common bile duct.  She presents today for worsening right upper quadrant pain.  She has been having pain since November that has been intermittent in nature, exacerbated with eating.  Alleviated by home narcotic medications.  Associated nausea and nonbloody nonbilious emesis.  No diarrhea.  No chest pain or shortness of breath.  She denies any fevers or chills.  No urinary symptoms.  Patient called her PCP who recommended presenting to the emergency department for further evaluation.   The history is provided by the patient.    Past Medical History Past Medical History:  Diagnosis Date  . Anxiety   . Arthritis   . Chronic back pain   . FH: restless leg syndrome   . GERD (gastroesophageal reflux disease)   . History of gallstones   . Hyperlipidemia   . Hypertension   . Hypothyroidism   . PONV (postoperative nausea and vomiting)    Patient Active Problem List   Diagnosis Date Noted  . Displaced spiral fracture of shaft of femur (HCC) 10/10/2013  . Femur fracture, left (HCC) 10/10/2013   Home Medication(s) Prior to Admission medications   Medication Sig Start Date End Date Taking? Authorizing Provider  ALPRAZolam Prudy Feeler) 1 MG tablet Take 1-2 tablets by mouth 2 (two) times daily.  10/01/13   [provider]  diltiazem (DILACOR XR) 240 MG 24 hr capsule Take 240 mg by mouth daily.    [provider]  levothyroxine (SYNTHROID, LEVOTHROID) 100 MCG tablet Take 1 tablet by mouth daily. 10/01/13   [provider]  meloxicam (MOBIC) 15 MG tablet Take 15 mg by mouth daily.    [provider]  oxyCODONE (OXY IR/ROXICODONE) 5 MG immediate release tablet Take 1 tablet  (5 mg total) by mouth every 4 (four) hours as needed for severe pain (moderate to severe pain). 10/11/13   Toni Arthurs, MD  oxyCODONE-acetaminophen (PERCOCET) 7.5-325 MG per tablet Take 1 tablet by mouth every 4 (four) hours as needed for pain.    [provider]  pravastatin (PRAVACHOL) 80 MG tablet Take 80 mg by mouth daily.    [provider]  rivaroxaban (XARELTO) 10 MG TABS tablet Take 1 tablet (10 mg total) by mouth daily. 10/11/13   Toni Arthurs, MD  rOPINIRole (REQUIP) 0.5 MG tablet Take 0.5-1 mg by mouth at bedtime as needed (Restless leg syndrome).    [provider]  spironolactone-hydrochlorothiazide (ALDACTAZIDE) 25-25 MG per tablet Take 1 tablet by mouth daily. 10/01/13   [provider]  Past Surgical History Past Surgical History:  Procedure Laterality Date  . BACK SURGERY    . 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 BILATERAL FOOT SURGERY    . FEMUR IM NAIL Left 10/10/2013   Procedure: INTRAMEDULLARY (IM) RETROGRADE FEMORAL NAILING;  Surgeon: Toni ArthursJohn Hewitt, MD;  Location: [facility] OR;  Service: Orthopedics;  Laterality: Left;   Family History Family History  Problem Relation Age of Onset  . Cancer Mother   . Cancer Father     Social History Social History   Tobacco Use  . Smoking status: Current Every Day Smoker    Packs/day: 1.00    Years: 43.00    Pack years: 43.00  . Smokeless tobacco: Never Used  Substance Use Topics  . Alcohol use: No  . Drug use: No   Allergies Ultram [tramadol]  Review of Systems Review of Systems All other systems are reviewed and are negative for acute change except as noted in the HPI  Physical Exam Vital Signs  I have reviewed the triage vital signs BP (!) 145/74   Pulse 89   Temp 97.9 F (36.6 C) (Oral)   Resp 18   SpO2 95%   Physical Exam  Constitutional: She is  oriented to person, place, and time. She appears well-developed and well-nourished. No distress.  HENT:  Head: Normocephalic and atraumatic.  Right Ear: External ear normal.  Left Ear: External ear normal.  Nose: Nose normal.  Eyes: Conjunctivae and EOM are normal. No scleral icterus.  Neck: Normal range of motion and phonation normal.  Cardiovascular: Normal rate and regular rhythm.  Pulmonary/Chest: Effort normal. No stridor. No respiratory distress.  Abdominal: She exhibits no distension. There is tenderness in the right upper quadrant. There is no rigidity, no rebound, no guarding, no CVA tenderness, no tenderness at McBurney's point and negative Murphy's sign. No hernia.  Musculoskeletal: Normal range of motion. She exhibits no edema.  Neurological: She is alert and oriented to person, place, and time.  Skin: She is not diaphoretic.  Psychiatric: She has a normal mood and affect. Her behavior is normal.  Vitals reviewed.   ED Results and Treatments Labs (all labs ordered are listed, but only abnormal results are displayed) Labs Reviewed  COMPREHENSIVE METABOLIC PANEL - Abnormal; Notable for the following components:      Result Value   Sodium 134 (*)    Potassium 3.2 (*)    Chloride 99 (*)    Glucose, Bld 152 (*)    Creatinine, Ser 1.25 (*)    AST 181 (*)    ALT 142 (*)    Alkaline Phosphatase 230 (*)    GFR calc non Af Amer 45 (*)    GFR calc Af Amer 52 (*)    All other components within normal limits  CBC - Abnormal; Notable for the following components:   WBC 13.3 (*)    All other components within normal limits  URINALYSIS, ROUTINE W REFLEX MICROSCOPIC - Abnormal; Notable for the following components:   Color, Urine AMBER (*)    APPearance TURBID (*)    Hgb urine dipstick SMALL (*)    Protein, ur 30 (*)    Leukocytes, UA LARGE (*)    Bacteria, UA MANY (*)    Squamous Epithelial / LPF TOO NUMEROUS TO COUNT (*)    All other components within normal limits    LIPASE, BLOOD  TSH  PROTIME-INR  APTT  COMPREHENSIVE METABOLIC PANEL  CBC  HIV ANTIBODY (ROUTINE  TESTING)                                                                                                                         EKG  EKG Interpretation  Date/Time:    Ventricular Rate:    PR Interval:    QRS Duration:   QT Interval:    QTC Calculation:   R Axis:     Text Interpretation:        Radiology US Abdomen Limited Ruq  Result Date: 09/08/2017 CLINICAL DATA:  Abdominal pain. EXAM: ULTRASOUND ABDOMEN LIMITED RIGHT UPPER QUADRANT COMPARISON:  None. FINDINGS: Gallbladder: Stones and sludge filled gallbladder with no wall thickening, pericholecystic fluid, or Murphy's sign. Common bile duct: Diameter: 12 mm today versus 14 mm previously. The distal duct is not seen due to shadowing bowel gas. Liver: Probable hepatic steatosis. No focal mass. Portal vein is patent on color Doppler imaging with normal direction of blood flow towards the liver. IMPRESSION: 1. Stones and sludge filled gallbladder without wall thickening, pericholecystic fluid, or Murphy's sign. 2. The common bile duct is dilated measuring 12 mm today versus 14 mm previously. This finding is concerning for common bile duct obstruction. An MRCP could better evaluate. Electronically Signed   By: Gerome Sam [name] M.D   On: 09/08/2017 19:17   Pertinent labs & imaging results that were available during my care of the patient were reviewed by me and considered in my medical decision making (see chart for details).  Medications Ordered in ED Medications  ALPRAZolam (XANAX) tablet 1 mg (not administered)  buPROPion (WELLBUTRIN XL) 24 hr tablet 300 mg (not administered)  diltiazem (DILACOR XR) 24 hr capsule 240 mg (not administered)  levothyroxine (SYNTHROID, LEVOTHROID) tablet 100 mcg (not administered)  meloxicam (MOBIC) tablet 15 mg (not administered)  ondansetron (ZOFRAN) injection 4 mg (not administered)   pantoprazole (PROTONIX) EC tablet 40 mg (not administered)  acetaminophen (TYLENOL) tablet 650 mg (not administered)    Or  acetaminophen (TYLENOL) suppository 650 mg (not administered)  0.9 % NaCl with KCl 20 mEq/ L  infusion (not administered)  HYDROmorphone (DILAUDID) injection 1 mg (1 mg Intravenous Given 09/08/17 2240)  cefTRIAXone (ROCEPHIN) 1 g in dextrose 5 % 50 mL IVPB (not administered)  spironolactone-hydrochlorothiazide (ALDACTAZIDE) 25-25 MG per tablet 1 tablet (not administered)  gadobenate dimeglumine (MULTIHANCE) injection 14 mL (not administered)  sodium chloride 0.9 % bolus 1,000 mL (0 mLs Intravenous Stopped 09/08/17 2034)  fentaNYL (SUBLIMAZE) injection 25 mcg (25 mcg Intravenous Given 09/08/17 1746)  HYDROmorphone (DILAUDID) injection 0.5 mg (0.5 mg Intravenous Given 09/08/17 1915)  ALPRAZolam (XANAX) tablet 1 mg (1 mg Oral Given 09/08/17 2155)  Procedures Procedures  (including critical care time)  Medical Decision Making / ED Course I have reviewed the nursing notes for this encounter and the patient's prior records (if available in EHR or on provided paperwork).  Clinical Course as of Dec 16 0001  Sat Sep 08, 2017  1657 CBC with leukocytosis.  Will obtain repeat ultrasound to assess for any evidence of acute cholecystitis or progression of the common bile dilatation.  Still pending CMP to assess for biliary obstruction.   [PC]  1930 Labs revealed elevated LFTs.  Right upper quadrant ultrasound without evidence of acute cholecystitis but reaffirmed the dilated common bile duct concerning for choledocholithiasis.  Discussed case with Dr. Selena BattenKim who admitted the patient for further workup and management.  [PC]    Clinical Course User Index [PC] Judaea Burgoon, Amadeo GarnetPedro Eduardo, MD       Final Clinical Impression(s) / ED Diagnoses Final  diagnoses:  Abdominal pain  Biliary obstruction  Calculus of gallbladder and bile duct with obstruction without cholecystitis      This chart was dictated using voice recognition software.  Despite best efforts to proofread,  errors can occur which can change the documentation meaning.   Nira Connardama, Gretta Samons Eduardo, MD 09/09/17 0001

## 2017-09-08 NOTE — H&P (Addendum)
TRH H&P   Patient Demographics:    Brooke Mccarthy, is a 61 y.o. female  MRN: [id number]   DOB - Aug 11, 1956  Admit Date - 09/08/2017  Outpatient Primary MD for the patient is Christain Sacramento, MD  Referring MD/NP/PA:  Addison Lank  Outpatient Specialists:   Patient coming from: home  Chief Complaint  Patient presents with  . sent by MD/gallstones      HPI:    Brooke Mccarthy  is a 61 y.o. female, w hypothyroidism, hypertension, hyperlipidemia, gerd apparently presents with c/o RUQ pain starting this am.  Pt notes intermittent pain for the past 6 weeks. Pt had n/v, but no bloody emesis.  Pt denies fever, chills, diarrhea, brbpr, black stool.  Pt contacted her pcp and sent to ER for evaluation.    In ED,   RUQ ultrasound IMPRESSION: 1. Stones and sludge filled gallbladder without wall thickening, pericholecystic fluid, or Murphy's sign. 2. The common bile duct is dilated measuring 12 mm today versus 14 mm previously. This finding is concerning for common bile duct obstruction. An MRCP could better evaluate.  ua wbc tntc, Rbc 6-30  Lipase 44 Na 134, K 3.2, Bun 15, Creatinine 1.25, Glucose 152 Ast 181, Alt 142, Alk phos 230, T. Bili 0.9 Wbc 13.3, Hgb 13.5, Plt 309   Pt will be admitted for RUQ pain and cholelithiasis.     Review of systems:    In addition to the HPI above,  No Fever-chills, No Headache, No changes with Vision or hearing, No problems swallowing food or Liquids, No Chest pain, Cough or Shortness of Breath,  No Blood in stool or Urine, No dysuria, No new skin rashes or bruises, No new joints pains-aches,  No new weakness, tingling, numbness in any extremity, No recent weight gain or loss, No polyuria, polydypsia or polyphagia, No significant Mental Stressors.  A full 10 point Review of Systems was done, except as stated above, all other  Review of Systems were negative.   With Past History of the following :    Past Medical History:  Diagnosis Date  . Anxiety   . Arthritis   . Chronic back pain   . FH: restless leg syndrome   . GERD (gastroesophageal reflux disease)   . History of gallstones   . Hyperlipidemia   . Hypertension   . Hypothyroidism   . PONV (postoperative nausea and vomiting)       Past Surgical History:  Procedure Laterality Date  . BACK SURGERY    . 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 BILATERAL FOOT SURGERY    . FEMUR IM NAIL Left 10/10/2013   Procedure: INTRAMEDULLARY (IM) RETROGRADE FEMORAL NAILING;  Surgeon: Wylene Simmer, MD;  Location: [place];  Service: Orthopedics;  Laterality: Left;  . TUBAL LIGATION        Social History:  Social History   Tobacco Use  . Smoking status: Current Every Day Smoker    Packs/day: 1.00    Years: 43.00    Pack years: 43.00  . Smokeless tobacco: Never Used  Substance Use Topics  . Alcohol use: No     Lives - at home  Mobility - walks by self   Family History :     Family History  Problem Relation Age of Onset  . Cancer Mother   . Cancer Father       Home Medications:   Prior to Admission medications   Medication Sig Start Date End Date Taking? Authorizing Provider  ALPRAZolam Duanne Moron) 1 MG tablet Take 1-2 tablets by mouth 2 (two) times daily.  10/01/13  Yes [provider]  amLODipine (NORVASC) 5 MG tablet Take 5 mg by mouth daily. 07/31/17  Yes [provider]  buPROPion (WELLBUTRIN XL) 300 MG 24 hr tablet Take 300 mg by mouth daily. 08/30/17  Yes [provider]  diltiazem (DILACOR XR) 240 MG 24 hr capsule Take 240 mg by mouth daily.   Yes [provider]  levothyroxine (SYNTHROID, LEVOTHROID) 100 MCG tablet Take 100 mcg by mouth daily.  10/01/13  Yes [provider]  Magnesium Gluconate 550 MG TABS Take 30 mg by mouth.   Yes [provider]  meloxicam (MOBIC) 15 MG tablet  Take 15 mg by mouth daily.   Yes [provider]  oxyCODONE-acetaminophen (PERCOCET) 10-325 MG tablet Take 1 tablet by mouth every 4 (four) hours as needed for pain.   Yes [provider]  pravastatin (PRAVACHOL) 80 MG tablet Take 80 mg by mouth daily.   Yes [provider]  spironolactone-hydrochlorothiazide (ALDACTAZIDE) 25-25 MG per tablet Take 1 tablet by mouth daily. 10/01/13  Yes [provider]  oxyCODONE (OXY IR/ROXICODONE) 5 MG immediate release tablet Take 1 tablet (5 mg total) by mouth every 4 (four) hours as needed for severe pain (moderate to severe pain). Patient not taking: Reported on 09/08/2017 10/11/13   Wylene Simmer, MD  rivaroxaban (XARELTO) 10 MG TABS tablet Take 1 tablet (10 mg total) by mouth daily. Patient not taking: Reported on 09/08/2017 10/11/13   Wylene Simmer, MD     Allergies:     Allergies  Allergen Reactions  . Hydrocodone-Acetaminophen Other (See Comments)    Unknown  . Ultram [Tramadol]     "makes my heart beat fast"     Physical Exam:   Vitals  Blood pressure 115/63, pulse 72, temperature 97.9 F (36.6 C), temperature source Oral, resp. rate 18, SpO2 100 %.   1. General lying in bed in NAD,    2. Normal affect and insight, Not Suicidal or Homicidal, Awake Alert, Oriented X 3.  3. No F.N deficits, ALL C.Nerves Intact, Strength 5/5 all 4 extremities, Sensation intact all 4 extremities, Plantars down going.  4. Ears and Eyes appear Normal, Conjunctivae clear, PERRLA. Moist Oral Mucosa.  5. Supple Neck, No JVD, No cervical lymphadenopathy appriciated, No Carotid Bruits.  6. Symmetrical Chest wall movement, Good air movement bilaterally, CTAB.  7. RRR, No Gallops, Rubs or Murmurs, No Parasternal Heave.  8. Positive Bowel Sounds, Abdomen Soft, No tenderness, No organomegaly appriciated,No rebound -guarding or rigidity.  9.  No Cyanosis, Normal Skin Turgor, No Skin Rash or Bruise.  10. Good muscle tone,  joints  appear normal , no effusions, Normal ROM.  11. No Palpable Lymph Nodes in Neck or Axillae     Data Review:  CBC Recent Labs  Lab 09/08/17 1628  WBC 13.3*  HGB 13.5  HCT 40.6  PLT 309  MCV 81.0  MCH 26.9  MCHC 33.3  RDW 13.7   ------------------------------------------------------------------------------------------------------------------  Chemistries  Recent Labs  Lab 09/08/17 1628  NA 134*  K 3.2*  CL 99*  CO2 24  GLUCOSE 152*  BUN 15  CREATININE 1.25*  CALCIUM 9.3  AST 181*  ALT 142*  ALKPHOS 230*  BILITOT 0.9   ------------------------------------------------------------------------------------------------------------------ CrCl cannot be calculated (Unknown ideal weight.). ------------------------------------------------------------------------------------------------------------------ No results for input(s): TSH, T4TOTAL, T3FREE, THYROIDAB in the last 72 hours.  Invalid input(s): FREET3  Coagulation profile No results for input(s): INR, PROTIME in the last 168 hours. ------------------------------------------------------------------------------------------------------------------- No results for input(s): DDIMER in the last 72 hours. -------------------------------------------------------------------------------------------------------------------  Cardiac Enzymes No results for input(s): CKMB, TROPONINI, MYOGLOBIN in the last 168 hours.  Invalid input(s): CK ------------------------------------------------------------------------------------------------------------------ No results found for: BNP   ---------------------------------------------------------------------------------------------------------------  Urinalysis    Component Value Date/Time   COLORURINE AMBER (A) 09/08/2017 1626   APPEARANCEUR TURBID (A) 09/08/2017 1626   LABSPEC 1.018 09/08/2017 1626   PHURINE 8.0 09/08/2017 1626   GLUCOSEU NEGATIVE 09/08/2017 1626   HGBUR  SMALL (A) 09/08/2017 1626   BILIRUBINUR NEGATIVE 09/08/2017 1626   KETONESUR NEGATIVE 09/08/2017 1626   PROTEINUR 30 (A) 09/08/2017 1626   UROBILINOGEN 0.2 01/16/2011 1209   NITRITE NEGATIVE 09/08/2017 1626   LEUKOCYTESUR LARGE (A) 09/08/2017 1626    ----------------------------------------------------------------------------------------------------------------   Imaging Results:    US Abdomen Limited Ruq  Result Date: 09/08/2017 CLINICAL DATA:  Abdominal pain. EXAM: ULTRASOUND ABDOMEN LIMITED RIGHT UPPER QUADRANT COMPARISON:  None. FINDINGS: Gallbladder: Stones and sludge filled gallbladder with no wall thickening, pericholecystic fluid, or Murphy's sign. Common bile duct: Diameter: 12 mm today versus 14 mm previously. The distal duct is not seen due to shadowing bowel gas. Liver: Probable hepatic steatosis. No focal mass. Portal vein is patent on color Doppler imaging with normal direction of blood flow towards the liver. IMPRESSION: 1. Stones and sludge filled gallbladder without wall thickening, pericholecystic fluid, or Murphy's sign. 2. The common bile duct is dilated measuring 12 mm today versus 14 mm previously. This finding is concerning for common bile duct obstruction. An MRCP could better evaluate. Electronically Signed   By: Dorise Bullion [name] M.D   On: 09/08/2017 19:17      Assessment & Plan:    Principal Problem:   Common bile duct dilation Active Problems:   Hypokalemia   Hyponatremia   Leukocytosis   RUQ pain, Common bile duct dilation mrcp  NPO  12lead ekg PT, PTT CXR Ns iv Depending up results of MRCP GI vs surgical consultation  N/v Zofran 53m iv q6h prn protonix 493mpo qday  Abnormal lft STOP Pravastatin  Leukocytosis likely secondary to pain/ uti Check cbc in am  Hypokalemia Replete Check cmp  In am  Hyponatremia Ns iv Check cmp in am  UTI Rocephin 1gm iv qday    DVT Prophylaxis  SCDs   AM Labs Ordered, also please review Full  Orders  Family Communication: Admission, patients condition and plan of care including tests being ordered have been discussed with the patient  who indicate understanding and agree with the plan and Code Status.  Code Status FULL CODE  Likely DC to  home  Condition GUARDED    Consults called: none  Admission status: inpatient   Time spent in minutes : 45   JaJani Gravel.D on 09/08/2017 at 8:46  PM  Between 7am to 7pm - Pager - [phone]. After 7pm go to [email or web address] - password [facility]  [facility] - Office  [phone]

## 2017-09-09 ENCOUNTER — Inpatient Hospital Stay (HOSPITAL_COMMUNITY): Payer: Medicare Other

## 2017-09-09 ENCOUNTER — Encounter (HOSPITAL_COMMUNITY): Payer: Self-pay | Admitting: *Deleted

## 2017-09-09 ENCOUNTER — Other Ambulatory Visit: Payer: Self-pay

## 2017-09-09 DIAGNOSIS — R7989 Other specified abnormal findings of blood chemistry: Secondary | ICD-10-CM

## 2017-09-09 DIAGNOSIS — R945 Abnormal results of liver function studies: Secondary | ICD-10-CM

## 2017-09-09 DIAGNOSIS — K8071 Calculus of gallbladder and bile duct without cholecystitis with obstruction: Secondary | ICD-10-CM

## 2017-09-09 DIAGNOSIS — K838 Other specified diseases of biliary tract: Secondary | ICD-10-CM

## 2017-09-09 DIAGNOSIS — K831 Obstruction of bile duct: Secondary | ICD-10-CM

## 2017-09-09 DIAGNOSIS — K8050 Calculus of bile duct without cholangitis or cholecystitis without obstruction: Secondary | ICD-10-CM

## 2017-09-09 LAB — TSH: TSH: 3.113 u[IU]/mL (ref 0.350–4.500)

## 2017-09-09 LAB — COMPREHENSIVE METABOLIC PANEL
ALBUMIN: 3.7 g/dL (ref 3.5–5.0)
ALT: 632 U/L — ABNORMAL HIGH (ref 14–54)
ANION GAP: 9 (ref 5–15)
AST: 612 U/L — AB (ref 15–41)
Alkaline Phosphatase: 309 U/L — ABNORMAL HIGH (ref 38–126)
BUN: 12 mg/dL (ref 6–20)
CHLORIDE: 102 mmol/L (ref 101–111)
CO2: 26 mmol/L (ref 22–32)
Calcium: 9.1 mg/dL (ref 8.9–10.3)
Creatinine, Ser: 0.94 mg/dL (ref 0.44–1.00)
GFR calc Af Amer: >60 mL/min (ref >60)
GFR calc non Af Amer: >60 mL/min (ref >60)
GLUCOSE: 100 mg/dL — AB (ref 65–99)
POTASSIUM: 4.2 mmol/L (ref 3.5–5.1)
SODIUM: 137 mmol/L (ref 135–145)
Total Bilirubin: 1 mg/dL (ref 0.3–1.2)
Total Protein: 7 g/dL (ref 6.5–8.1)

## 2017-09-09 LAB — PROTIME-INR
INR: 0.93
PROTHROMBIN TIME: 12.4 s (ref 11.4–15.2)

## 2017-09-09 LAB — CBC
HEMATOCRIT: 40.1 % (ref 36.0–46.0)
HEMOGLOBIN: 13 g/dL (ref 12.0–15.0)
MCH: 26.4 pg (ref 26.0–34.0)
MCHC: 32.4 g/dL (ref 30.0–36.0)
MCV: 81.5 fL (ref 78.0–100.0)
Platelets: 314 10*3/uL (ref 150–400)
RBC: 4.92 MIL/uL (ref 3.87–5.11)
RDW: 13.9 % (ref 11.5–15.5)
WBC: 5.7 10*3/uL (ref 4.0–10.5)

## 2017-09-09 LAB — APTT: APTT: 27 s (ref 24–36)

## 2017-09-09 LAB — HIV ANTIBODY (ROUTINE TESTING W REFLEX): HIV Screen 4th Generation wRfx: NONREACTIVE

## 2017-09-09 MED ORDER — PANTOPRAZOLE SODIUM 40 MG IV SOLR
40.0000 mg | INTRAVENOUS | Status: DC
  Administered 2017-09-09 – 2017-09-12 (×3): 40 mg via INTRAVENOUS
  Filled 2017-09-09 (×3): qty 40

## 2017-09-09 MED ORDER — KCL IN DEXTROSE-NACL 20-5-0.9 MEQ/L-%-% IV SOLN
INTRAVENOUS | Status: DC
  Administered 2017-09-09 – 2017-09-10 (×2): via INTRAVENOUS
  Filled 2017-09-09 (×3): qty 1000

## 2017-09-09 NOTE — Consult Note (Signed)
[facility] Surgery Consult Note  Brooke Mccarthy [birth year]  [id number].    Requesting MD: Carolin Sicks Chief Complaint/Reason for Consult: gallstones  HPI:  Brooke Mccarthy is a 61yo female admitted to [facility] 12/15 with cholelithiasis. States that she was told about 15 years ago that she had gallstones, but she never had any pain until about 1 month ago. Since then she has had more frequent, more severe attacks. Most recently yesterday morning she started having RUQ pain associated with nausea, vomiting, and chills. Denies fever, diarrhea, dysuria. States that the pain would not subside so she decided to come to the ED. Abdominal u/s showed stones and sludge in the gallbladder without signs of acute cholecystitis, and a common bile duct measuring 38m concerning for common bile duct obstruction. MRCP showed common bile duct increased in caliber and an equivocal filling defect within the distal CBD. WBC 13.3, lipase 44, AST 181, ALT 142, alk phos 230, total bilirubin 0.9. She was started on rocephin. This morning WBC 5.7, AST 612, ALT 632, alk phos 309, and total bilirubin 1.0. Since admission Ms. HDecairestates that she feels much better. She has mild RUQ, worse with palpation. Denies n/v. States that she is hungry. GI consult is pending.  PMH significant for HTN, HLD, Hypothyroidism, Anxiety, Chronic pain  Abdominal surgical history: none Anticoagulants: none Former smoker Disabled  ROS: Review of Systems  Constitutional: Positive for chills. Negative for fever.  HENT: Negative.   Eyes: Negative.   Respiratory: Negative.   Cardiovascular: Negative.   Gastrointestinal: Positive for abdominal pain, nausea and vomiting. Negative for blood in stool, constipation, diarrhea and melena.  Genitourinary: Negative.   Musculoskeletal: Positive for back pain.  Skin: Negative.   Neurological: Negative.     All systems reviewed and otherwise negative except for as above  Family History   Problem Relation Age of Onset  . Cancer Mother   . Cancer Father     Past Medical History:  Diagnosis Date  . Anxiety   . Arthritis   . Chronic back pain   . FH: restless leg syndrome   . GERD (gastroesophageal reflux disease)   . History of gallstones   . Hyperlipidemia   . Hypertension   . Hypothyroidism   . PONV (postoperative nausea and vomiting)     Past Surgical History:  Procedure Laterality Date  . BACK SURGERY    . 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 BILATERAL FOOT SURGERY    . FEMUR IM NAIL Left 10/10/2013   Procedure: INTRAMEDULLARY (IM) RETROGRADE FEMORAL NAILING;  Surgeon: JWylene Simmer MD;  [place]  Service: Orthopedics;  Laterality: Left;  . TUBAL LIGATION      Social History:  reports that she has been smoking.  She has a 43.00 pack-year smoking history. she has never used smokeless tobacco. She reports that she does not drink alcohol or use drugs.  Allergies:  Allergies  Allergen Reactions  . Hydrocodone-Acetaminophen Other (See Comments)    Unknown  . Ultram [Tramadol]     "makes my heart beat fast"    Medications Prior to Admission  Medication Sig Dispense Refill  . ALPRAZolam (XANAX) 1 MG tablet Take 1-2 tablets by mouth 2 (two) times daily.     .Marland KitchenamLODipine (NORVASC) 5 MG tablet Take 5 mg by mouth daily.    .Marland KitchenbuPROPion (WELLBUTRIN XL) 300 MG 24 hr tablet Take 300 mg by mouth daily.    .Marland Kitchendiltiazem (DILACOR XR) 240 MG 24  hr capsule Take 240 mg by mouth daily.    Marland Kitchen levothyroxine (SYNTHROID, LEVOTHROID) 100 MCG tablet Take 100 mcg by mouth daily.     . Magnesium Gluconate 550 MG TABS Take 30 mg by mouth.    . meloxicam (MOBIC) 15 MG tablet Take 15 mg by mouth daily.    Marland Kitchen oxyCODONE-acetaminophen (PERCOCET) 10-325 MG tablet Take 1 tablet by mouth every 4 (four) hours as needed for pain.    . pravastatin (PRAVACHOL) 80 MG tablet Take 80 mg by mouth daily.    Marland Kitchen spironolactone-hydrochlorothiazide (ALDACTAZIDE) 25-25 MG per tablet Take 1  tablet by mouth daily.    Marland Kitchen oxyCODONE (OXY IR/ROXICODONE) 5 MG immediate release tablet Take 1 tablet (5 mg total) by mouth every 4 (four) hours as needed for severe pain (moderate to severe pain). (Patient not taking: Reported on 09/08/2017) 30 tablet 0    Prior to Admission medications   Medication Sig Start Date End Date Taking? Authorizing Provider  ALPRAZolam Duanne Moron) 1 MG tablet Take 1-2 tablets by mouth 2 (two) times daily.  10/01/13  Yes [provider]  amLODipine (NORVASC) 5 MG tablet Take 5 mg by mouth daily. 07/31/17  Yes [provider]  buPROPion (WELLBUTRIN XL) 300 MG 24 hr tablet Take 300 mg by mouth daily. 08/30/17  Yes [provider]  diltiazem (DILACOR XR) 240 MG 24 hr capsule Take 240 mg by mouth daily.   Yes [provider]  levothyroxine (SYNTHROID, LEVOTHROID) 100 MCG tablet Take 100 mcg by mouth daily.  10/01/13  Yes [provider]  Magnesium Gluconate 550 MG TABS Take 30 mg by mouth.   Yes [provider]  meloxicam (MOBIC) 15 MG tablet Take 15 mg by mouth daily.   Yes [provider]  oxyCODONE-acetaminophen (PERCOCET) 10-325 MG tablet Take 1 tablet by mouth every 4 (four) hours as needed for pain.   Yes [provider]  pravastatin (PRAVACHOL) 80 MG tablet Take 80 mg by mouth daily.   Yes [provider]  spironolactone-hydrochlorothiazide (ALDACTAZIDE) 25-25 MG per tablet Take 1 tablet by mouth daily. 10/01/13  Yes [provider]  oxyCODONE (OXY IR/ROXICODONE) 5 MG immediate release tablet Take 1 tablet (5 mg total) by mouth every 4 (four) hours as needed for severe pain (moderate to severe pain). Patient not taking: Reported on 09/08/2017 10/11/13   Wylene Simmer, MD    Blood pressure (!) 107/58, pulse (!) 53, temperature 97.9 F (36.6 C), temperature source Oral, resp. rate 18, height '4\' 11"'  (1.499 m), weight 162 lb 12.8 oz (73.8 kg), SpO2 98 %. Physical Exam: General: pleasant,  WD/WN white female who is laying in bed in NAD HEENT: head is normocephalic, atraumatic.  Sclera are noninjected.  Pupils equal and round.  Ears and nose without any masses or lesions.  Mouth is pink and moist. Dentition fair Heart: regular, rate, and rhythm.  No obvious murmurs, gallops, or rubs noted.  Palpable pedal pulses bilaterally Lungs: CTAB, no wheezes, rhonchi, or rales noted.  Respiratory effort nonlabored Abd: soft, ND, +BS, no masses, hernias, or organomegaly. Mild TTP RUQ. Negative Murphy sign MS: all 4 extremities are symmetrical with no cyanosis, clubbing, or edema. Skin: warm and dry with no masses, lesions, or rashes Psych: A&Ox3 with an appropriate affect. Neuro: cranial nerves grossly intact, extremity CSM intact bilaterally, normal speech  Results for orders placed or performed during the hospital encounter of 09/08/17 (from the past 48 hour(s))  Urinalysis, Routine w reflex microscopic  Status: Abnormal   Collection Time: 09/08/17  4:26 PM  Result Value Ref Range   Color, Urine AMBER (A) YELLOW    Comment: BIOCHEMICALS MAY BE AFFECTED BY COLOR   APPearance TURBID (A) CLEAR   Specific Gravity, Urine 1.018 1.005 - 1.030   pH 8.0 5.0 - 8.0   Glucose, UA NEGATIVE NEGATIVE mg/dL   Hgb urine dipstick SMALL (A) NEGATIVE   Bilirubin Urine NEGATIVE NEGATIVE   Ketones, ur NEGATIVE NEGATIVE mg/dL   Protein, ur 30 (A) NEGATIVE mg/dL   Nitrite NEGATIVE NEGATIVE   Leukocytes, UA LARGE (A) NEGATIVE   RBC / HPF 6-30 0 - 5 RBC/hpf   WBC, UA TOO NUMEROUS TO COUNT 0 - 5 WBC/hpf   Bacteria, UA MANY (A) NONE SEEN   Squamous Epithelial / LPF TOO NUMEROUS TO COUNT (A) NONE SEEN   Mucus PRESENT   Lipase, blood     Status: None   Collection Time: 09/08/17  4:28 PM  Result Value Ref Range   Lipase 44 11 - 51 U/L  Comprehensive metabolic panel     Status: Abnormal   Collection Time: 09/08/17  4:28 PM  Result Value Ref Range   Sodium 134 (L) 135 - 145 mmol/L   Potassium 3.2 (L)  3.5 - 5.1 mmol/L   Chloride 99 (L) 101 - 111 mmol/L   CO2 24 22 - 32 mmol/L   Glucose, Bld 152 (H) 65 - 99 mg/dL   BUN 15 6 - 20 mg/dL   Creatinine, Ser 1.25 (H) 0.44 - 1.00 mg/dL   Calcium 9.3 8.9 - 10.3 mg/dL   Total Protein 7.4 6.5 - 8.1 g/dL   Albumin 4.0 3.5 - 5.0 g/dL   AST 181 (H) 15 - 41 U/L   ALT 142 (H) 14 - 54 U/L   Alkaline Phosphatase 230 (H) 38 - 126 U/L   Total Bilirubin 0.9 0.3 - 1.2 mg/dL   GFR calc non Af Amer 45 (L) >60 mL/min   GFR calc Af Amer 52 (L) >60 mL/min    Comment: (NOTE) The eGFR has been calculated using the CKD EPI equation. This calculation has not been validated in all clinical situations. eGFR's persistently <60 mL/min signify possible Chronic Kidney Disease.    Anion gap 11 5 - 15  CBC     Status: Abnormal   Collection Time: 09/08/17  4:28 PM  Result Value Ref Range   WBC 13.3 (H) 4.0 - 10.5 K/uL   RBC 5.01 3.87 - 5.11 MIL/uL   Hemoglobin 13.5 12.0 - 15.0 g/dL   HCT 40.6 36.0 - 46.0 %   MCV 81.0 78.0 - 100.0 fL   MCH 26.9 26.0 - 34.0 pg   MCHC 33.3 30.0 - 36.0 g/dL   RDW 13.7 11.5 - 15.5 %   Platelets 309 150 - 400 K/uL  TSH     Status: None   Collection Time: 09/09/17  5:40 AM  Result Value Ref Range   TSH 3.113 0.350 - 4.500 uIU/mL    Comment: Performed by a 3rd Generation assay with a functional sensitivity of <=0.01 uIU/mL.  Protime-INR     Status: None   Collection Time: 09/09/17  5:40 AM  Result Value Ref Range   Prothrombin Time 12.4 11.4 - 15.2 seconds   INR 0.93   APTT     Status: None   Collection Time: 09/09/17  5:40 AM  Result Value Ref Range   aPTT 27 24 - 36 seconds  Comprehensive metabolic  panel     Status: Abnormal   Collection Time: 09/09/17  5:40 AM  Result Value Ref Range   Sodium 137 135 - 145 mmol/L   Potassium 4.2 3.5 - 5.1 mmol/L   Chloride 102 101 - 111 mmol/L   CO2 26 22 - 32 mmol/L   Glucose, Bld 100 (H) 65 - 99 mg/dL   BUN 12 6 - 20 mg/dL   Creatinine, Ser 0.94 0.44 - 1.00 mg/dL   Calcium 9.1 8.9  - 10.3 mg/dL   Total Protein 7.0 6.5 - 8.1 g/dL   Albumin 3.7 3.5 - 5.0 g/dL   AST 612 (H) 15 - 41 U/L   ALT 632 (H) 14 - 54 U/L   Alkaline Phosphatase 309 (H) 38 - 126 U/L   Total Bilirubin 1.0 0.3 - 1.2 mg/dL   GFR calc non Af Amer >60 >60 mL/min   GFR calc Af Amer >60 >60 mL/min    Comment: (NOTE) The eGFR has been calculated using the CKD EPI equation. This calculation has not been validated in all clinical situations. eGFR's persistently <60 mL/min signify possible Chronic Kidney Disease.    Anion gap 9 5 - 15  CBC     Status: None   Collection Time: 09/09/17  5:40 AM  Result Value Ref Range   WBC 5.7 4.0 - 10.5 K/uL   RBC 4.92 3.87 - 5.11 MIL/uL   Hemoglobin 13.0 12.0 - 15.0 g/dL   HCT 40.1 36.0 - 46.0 %   MCV 81.5 78.0 - 100.0 fL   MCH 26.4 26.0 - 34.0 pg   MCHC 32.4 30.0 - 36.0 g/dL   RDW 13.9 11.5 - 15.5 %   Platelets 314 150 - 400 K/uL   Dg Chest 2 View  Result Date: 09/09/2017 CLINICAL DATA:  Nausea and vomiting today. EXAM: CHEST  2 VIEW COMPARISON:  10/10/2013. FINDINGS: The heart size and mediastinal contours are within normal limits. Aortic atherosclerosis at the arch. No aneurysm. Both lungs are clear. The visualized skeletal structures are unremarkable. IMPRESSION: No active cardiopulmonary disease.  Aortic atherosclerosis. Electronically Signed   By: Ashley Royalty M.D.   On: 09/09/2017 01:14   Mr 3d Recon At Scanner  Result Date: 09/09/2017 CLINICAL DATA:  Elevated LFTs.Gallstones. Increase caliber of the common bile duct EXAM: MRI ABDOMEN WITHOUT AND WITH CONTRAST (INCLUDING MRCP) TECHNIQUE: Multiplanar multisequence MR imaging of the abdomen was performed both before and after the administration of intravenous contrast. Heavily T2-weighted images of the biliary and pancreatic ducts were obtained, and three-dimensional MRCP images were rendered by post processing. CONTRAST:  17m MULTIHANCE GADOBENATE DIMEGLUMINE 529 MG/ML IV SOLN COMPARISON:  09/08/2017  FINDINGS: Lower chest: No acute findings. Hepatobiliary: No focal liver abnormality. Mild intrahepatic biliary dilatation. There is diffuse hepatic steatosis. Stones identified within the lumen of the gallbladder. These measure up to 2 cm. Increase caliber of the CBD which measures 7 mm in maximum diameter. There is a questionable tiny stone within the distal CBD measuring 4 mm, image 17 of series 4 and image 24 of series 7. Pancreas: No pancreatic ductal dilatation or inflammation. No mass. Spleen:  Within normal limits in size and appearance. Adrenals/Urinary Tract: No masses identified. No evidence of hydronephrosis. Small right kidney cysts measures 8 mm. Stomach/Bowel: Visualized portions within the abdomen are unremarkable. Vascular/Lymphatic: No pathologically enlarged lymph nodes identified. No abdominal aortic aneurysm demonstrated. Other:  None. Musculoskeletal: No suspicious bone lesions identified. IMPRESSION: 1. Gallstones. The common bile duct is increased in  caliber and there is a an equivocal filling defect within the distal CBD which may represent a small 4 mm common bile duct stone. 2. Hepatic steatosis. Electronically Signed   By: Kerby Moors M.D.   On: 09/09/2017 10:20   Mr Abdomen Mrcp W Wo Contast  Result Date: 09/09/2017 CLINICAL DATA:  Elevated LFTs.Gallstones. Increase caliber of the common bile duct EXAM: MRI ABDOMEN WITHOUT AND WITH CONTRAST (INCLUDING MRCP) TECHNIQUE: Multiplanar multisequence MR imaging of the abdomen was performed both before and after the administration of intravenous contrast. Heavily T2-weighted images of the biliary and pancreatic ducts were obtained, and three-dimensional MRCP images were rendered by post processing. CONTRAST:  36m MULTIHANCE GADOBENATE DIMEGLUMINE 529 MG/ML IV SOLN COMPARISON:  09/08/2017 FINDINGS: Lower chest: No acute findings. Hepatobiliary: No focal liver abnormality. Mild intrahepatic biliary dilatation. There is diffuse hepatic  steatosis. Stones identified within the lumen of the gallbladder. These measure up to 2 cm. Increase caliber of the CBD which measures 7 mm in maximum diameter. There is a questionable tiny stone within the distal CBD measuring 4 mm, image 17 of series 4 and image 24 of series 7. Pancreas: No pancreatic ductal dilatation or inflammation. No mass. Spleen:  Within normal limits in size and appearance. Adrenals/Urinary Tract: No masses identified. No evidence of hydronephrosis. Small right kidney cysts measures 8 mm. Stomach/Bowel: Visualized portions within the abdomen are unremarkable. Vascular/Lymphatic: No pathologically enlarged lymph nodes identified. No abdominal aortic aneurysm demonstrated. Other:  None. Musculoskeletal: No suspicious bone lesions identified. IMPRESSION: 1. Gallstones. The common bile duct is increased in caliber and there is a an equivocal filling defect within the distal CBD which may represent a small 4 mm common bile duct stone. 2. Hepatic steatosis. Electronically Signed   By: TKerby MoorsM.D.   On: 09/09/2017 10:20   UKoreaAbdomen Limited Ruq  Result Date: 09/08/2017 CLINICAL DATA:  Abdominal pain. EXAM: ULTRASOUND ABDOMEN LIMITED RIGHT UPPER QUADRANT COMPARISON:  None. FINDINGS: Gallbladder: Stones and sludge filled gallbladder with no wall thickening, pericholecystic fluid, or Murphy's sign. Common bile duct: Diameter: 12 mm today versus 14 mm previously. The distal duct is not seen due to shadowing bowel gas. Liver: Probable hepatic steatosis. No focal mass. Portal vein is patent on color Doppler imaging with normal direction of blood flow towards the liver. IMPRESSION: 1. Stones and sludge filled gallbladder without wall thickening, pericholecystic fluid, or Murphy's sign. 2. The common bile duct is dilated measuring 12 mm today versus 14 mm previously. This finding is concerning for common bile duct obstruction. An MRCP could better evaluate. Electronically Signed   By: DDorise BullionIII M.D   On: 09/08/2017 19:17   Anti-infectives (From admission, onward)   Start     Dose/Rate Route Frequency Ordered Stop   09/08/17 2300  cefTRIAXone (ROCEPHIN) 1 g in dextrose 5 % 50 mL IVPB     1 g 100 mL/hr over 30 Minutes Intravenous Every 24 hours 09/08/17 2111          Assessment/Plan HTN HLD Hypothyroidism Anxiety - takes xanax at home Chronic pain - on percocet 10/325 at least 5-6x/day at home  Symptomatic cholelithiasis Choledocholithiasis Transaminitis - no prior abdominal surgery - Abdominal u/s showed stones and sludge in the gallbladder without signs of acute cholecystitis, and a common bile duct measuring 191mconcerning for common bile duct obstruction - MRCP showed common bile duct increased in caliber and an equivocal filling defect within the distal CBD.  - on arrival WBC  13.3, lipase 44, AST 181, ALT 142, alk phos 230, total bilirubin 0.9. - today WBC 5.7, AST 612, ALT 632, alk phos 309, and total bilirubin 1.0. - Patient would like benefit from cholecystectomy. Concern for choledocholithiasis, will await GI recommendations regarding ERCP and transaminitis. Continue rocephin. If not having a procedure today, ok for clear liquids from our standpoint. Will continue to follow.  ID - rocephin 12/15>> VTE - SCDs FEN - IVF, NPO Foley - none Follow up - TBD  Wellington Hampshire, [facility] Surgery 09/09/2017, 11:11 AM Pager: [phone] Consults: [phone] Mon-Fri 7:00 am-4:30 pm Sat-Sun 7:00 am-11:30 am

## 2017-09-09 NOTE — Consult Note (Signed)
Consultation  Referring Provider: [facility]/James Kim MD Primary Care Physician:  Barbie BannerWilson, Fred H, MD Primary Gastroenterologist:  none  Reason for Consultation:   CBD stone  HPI: Brooke Mccarthy is a 61 y.o. female who was admitted through the emergency room last night with 1 day history of severe upper abdominal and right upper quadrant abdominal pain associated with nausea. Patient states that she has known that she has had gallstones for several years. She had initial episode of pain in November after eating pizza when she developed acute severe right upper quadrant pain radiating around into her back. She had 2 episodes on Thanksgiving day each lasting about an hour and associated with nausea. She had another episode a little over a week ago which she said was worse and associated with nausea and vomiting. The episode yesterday was worse than any of the previous and lasted longer, and eventually that subsided after 1.5-2 hours. She's not aware of having any fever or chills with these episodes, and states she feels fine in between the episodes. She had undergone an outpatient upper abdominal ultrasound on 09/05/2017 and was found to have multiple gallstones the largest 2.9 cm, gallbladder sludge, no gallbladder wall thickening and a common bile duct of 14 mm, no definite stones seen. Labs in the ER last evening showed WBC of 13.3, hemoglobin 13.5, INR of 0.93. T bili 0.9, alkaline phosphatase 2:30, AST of 181 and ALT of 142. Patient had MRCP done late last night which showed diffuse hepatic steatosis, multiple gallstones and a common bile duct of 7 mm with possible tiny stone in the distal common bile duct measuring 4 mm area Today's labs show WBC down to 5.7, T bili 1.0, alkaline phosphatase 309, AST of 612 and ALT of 632.  Other medical problems include hypothyroidism, hypertension hyperlipidemia and GERD,    Past Medical History:  Diagnosis Date  . Anxiety   . Arthritis   .  Chronic back pain   . FH: restless leg syndrome   . GERD (gastroesophageal reflux disease)   . History of gallstones   . Hyperlipidemia   . Hypertension   . Hypothyroidism   . PONV (postoperative nausea and vomiting)     Past Surgical History:  Procedure Laterality Date  . BACK SURGERY    . 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 BILATERAL FOOT SURGERY    . FEMUR IM NAIL Left 10/10/2013   Procedure: INTRAMEDULLARY (IM) RETROGRADE FEMORAL NAILING;  Surgeon: Toni ArthursJohn Hewitt, MD;  Location: [facility] OR;  Service: Orthopedics;  Laterality: Left;  . TUBAL LIGATION      Prior to Admission medications   Medication Sig Start Date End Date Taking? Authorizing Provider  ALPRAZolam Prudy Feeler(XANAX) 1 MG tablet Take 1-2 tablets by mouth 2 (two) times daily.  10/01/13  Yes [provider]  amLODipine (NORVASC) 5 MG tablet Take 5 mg by mouth daily. 07/31/17  Yes [provider]  buPROPion (WELLBUTRIN XL) 300 MG 24 hr tablet Take 300 mg by mouth daily. 08/30/17  Yes [provider]  diltiazem (DILACOR XR) 240 MG 24 hr capsule Take 240 mg by mouth daily.   Yes [provider]  levothyroxine (SYNTHROID, LEVOTHROID) 100 MCG tablet Take 100 mcg by mouth daily.  10/01/13  Yes [provider]  Magnesium Gluconate 550 MG TABS Take 30 mg by mouth.   Yes [provider]  meloxicam (MOBIC) 15 MG tablet Take 15 mg by mouth daily.   Yes  [provider]  oxyCODONE-acetaminophen (PERCOCET) 10-325 MG tablet Take 1 tablet by mouth every 4 (four) hours as needed for pain.   Yes [provider]  pravastatin (PRAVACHOL) 80 MG tablet Take 80 mg by mouth daily.   Yes [provider]  spironolactone-hydrochlorothiazide (ALDACTAZIDE) 25-25 MG per tablet Take 1 tablet by mouth daily. 10/01/13  Yes [provider]  oxyCODONE (OXY IR/ROXICODONE) 5 MG immediate release tablet Take 1 tablet (5 mg total) by mouth every 4 (four) hours as needed for severe pain  (moderate to severe pain). Patient not taking: Reported on 09/08/2017 10/11/13   Toni ArthursHewitt, John, MD    Current Facility-Administered Medications  Medication Dose Route Frequency Provider Last Rate Last Dose  . acetaminophen (TYLENOL) tablet 650 mg  650 mg Oral Q6H PRN Pearson GrippeKim, James, MD       Or  . acetaminophen (TYLENOL) suppository 650 mg  650 mg Rectal Q6H PRN Pearson GrippeKim, James, MD      . ALPRAZolam Prudy Feeler(XANAX) tablet 1 mg  1 mg Oral BID Pearson GrippeKim, James, MD   1 mg at 09/09/17 1026  . buPROPion (WELLBUTRIN XL) 24 hr tablet 300 mg  300 mg Oral Daily Pearson GrippeKim, James, MD   300 mg at 09/09/17 1025  . cefTRIAXone (ROCEPHIN) 1 g in dextrose 5 % 50 mL IVPB  1 g Intravenous Q24H Pearson GrippeKim, James, MD   Stopped at 09/09/17 0302  . dextrose 5 % and 0.9 % NaCl with KCl 20 mEq/L infusion   Intravenous Continuous Maxie BarbBhandari, Dron Prasad, MD      . diltiazem (CARDIZEM CD) 24 hr capsule 240 mg  240 mg Oral Daily Pearson GrippeKim, James, MD   240 mg at 09/09/17 1027  . HYDROmorphone (DILAUDID) injection 1 mg  1 mg Intravenous Q4H PRN Pearson GrippeKim, James, MD   1 mg at 09/09/17 1035  . levothyroxine (SYNTHROID, LEVOTHROID) tablet 100 mcg  100 mcg Oral QAC breakfast Pearson GrippeKim, James, MD   100 mcg at 09/09/17 1026  . meloxicam (MOBIC) tablet 15 mg  15 mg Oral Daily Pearson GrippeKim, James, MD   15 mg at 09/09/17 1026  . ondansetron (ZOFRAN) injection 4 mg  4 mg Intravenous Q6H PRN Pearson GrippeKim, James, MD   4 mg at 09/09/17 0202  . pantoprazole (PROTONIX) injection 40 mg  40 mg Intravenous Q24H Maxie BarbBhandari, Dron Prasad, MD        Allergies as of 09/08/2017 - Review Complete 09/08/2017  Allergen Reaction Noted  . Hydrocodone-acetaminophen Other (See Comments) 01/31/2016  . Ultram [tramadol]  10/10/2013    Family History  Problem Relation Age of Onset  . Cancer Mother   . Cancer Father     Social History   Socioeconomic History  . Marital status: Legally Separated    Spouse name: Not on file  . Number of children: Not on file  . Years of education: Not on file  . Highest education  level: Not on file  Social Needs  . Financial resource strain: Not on file  . Food insecurity - worry: Not on file  . Food insecurity - inability: Not on file  . Transportation needs - medical: Not on file  . Transportation needs - non-medical: Not on file  Occupational History  . Not on file  Tobacco Use  . Smoking status: Current Every Day Smoker    Packs/day: 1.00    Years: 43.00    Pack years: 43.00  . Smokeless tobacco: Never Used  Substance and Sexual Activity  . Alcohol use: No  .  Drug use: No  . Sexual activity: No  Other Topics Concern  . Not on file  Social History Narrative  . Not on file    Review of Systems: Pertinent positive and negative review of systems were noted in the above HPI section.  All other review of systems was otherwise negative.  Physical Exam: Vital signs in last 24 hours: Temp:  [97.9 F (36.6 C)-98 F (36.7 C)] 97.9 F (36.6 C) (12/16 0939) Pulse Rate:  [53-89] 53 (12/16 0939) Resp:  [16-18] 18 (12/16 0939) BP: (101-145)/(58-74) 107/58 (12/16 0939) SpO2:  [95 %-100 %] 98 % (12/16 0939) Weight:  [162 lb 12.8 oz (73.8 kg)] 162 lb 12.8 oz (73.8 kg) (12/15 2225) Last BM Date: 09/06/17 General:   Alert,  Well-developed, well-nourished, older white female in no acute distress, pleasant no jaundice, daughter present in room Head:  Normocephalic and atraumatic. Eyes:  Sclera clear, no icterus.   Conjunctiva pink. Ears:  Normal auditory acuity. Nose:  No deformity, discharge,  or lesions. Mouth:  No deformity or lesions.   Neck:  Supple; no masses or thyromegaly. Lungs:  Clear throughout to auscultation.   No wheezes, crackles, or rhonchi. Heart:  Regular rate and rhythm; no murmurs, clicks, rubs,  or gallops. Abdomen:  Soft,tender right upper quadrant with deep palpation, BS active,nonpalp mass or hsm.   Rectal:  Deferred  Msk:  Symmetrical without gross deformities. . Pulses:  Normal pulses noted. Extremities:  Without clubbing or  edema. Neurologic:  Alert and  oriented x4;  grossly normal neurologically. Skin:  Intact without significant lesions or rashes.. Psych:  Alert and cooperative. Normal mood and affect.  Intake/Output from previous day: 12/15 0701 - 12/16 0700 In: 711.3 [P.O.:600; I.V.:61.3; IV Piggyback:50] Out: 700 [Urine:700] Intake/Output this shift: Total I/O In: 240 [P.O.:240] Out: 0   Lab Results: Recent Labs    09/08/17 1628 09/09/17 0540  WBC 13.3* 5.7  HGB 13.5 13.0  HCT 40.6 40.1  PLT 309 314   BMET Recent Labs    09/08/17 1628 09/09/17 0540  NA 134* 137  K 3.2* 4.2  CL 99* 102  CO2 24 26  GLUCOSE 152* 100*  BUN 15 12  CREATININE 1.25* 0.94  CALCIUM 9.3 9.1   LFT Recent Labs    09/09/17 0540  PROT 7.0  ALBUMIN 3.7  AST 612*  ALT 632*  ALKPHOS 309*  BILITOT 1.0   PT/INR Recent Labs    09/09/17 0540  LABPROT 12.4  INR 0.93     IMPRESSION:  #10 61 year old white female with several recent episodes of right upper quadrant pain and nausea consistent with biliary colic, patient admitted with a severe episode yesterday. She has multiple gallstones in her gallbladder and on MRCP has a dilated CBD of 7 mm with probable tiny stone in the distal common bile duct measuring 4 mm. LFTs are elevated, total bilirubin normal. She had some mild leukocytosis on admission which has resolved. Picture consistent with cholelithiasis, possible early cholecystitis and choledocholithiasis There is no evidence for cholangitis.  #2 hypertension #3 GERD #4 hyperlipidemia #5 hypothyroidism #6 UTI-covering with Rocephin  Plan; Patient has been started on Rocephin.  Will allow full liquids today, nothing by mouth after midnight Patient will need ERCP and stone extraction with Dr. Leone Payor. This will be scheduled for Monday, 09/10/2017. Procedure was explained in detail to the patient and her daughter including indications risks and benefits and she is agreeable to proceed. Hold  Mobic Hopefully she can be  scheduled for laparoscopic cholecystectomy during this admission. Repeat labs in a.m.     Amy Esterwood  09/09/2017, 11:27 AM

## 2017-09-09 NOTE — Progress Notes (Signed)
PROGRESS NOTE    Brooke Mccarthy  AVW:[id number] DOB: 14-Mar-1956 DOA: 09/08/2017 PCP: Barbie BannerWilson, Fred H, MD   Brief Narrative: 61 year old female with history of hypothyroidism, hypertension, hyperlipidemia, GERD presented with right upper quadrant pain for 1 day.  Patient with nausea vomiting and nonbloody emesis.  Right upper quadrant consistent with cholelithiasis, dilated bile duct 12 mm concerning for common bile duct obstruction.  Admitted for further evaluation.  Assessment & Plan:  #Right upper quadrant pain, cholelithiasis and dilated common bile duct -Patient also with elevated LFTs concerning for biliary stone obstruction.  Pending MRCP results.  I consulted GI and spoke with Dr.Armbruster. -Also consulted general surgery because of cholelithiasis and right upper quadrant pain. -Patient is currently n.p.o. except meds and sips.  Change IV fluid with dextrose and electrolytes.  Start Protonix IV. -Zofran as needed for the management of nausea and vomiting.  #Hypokalemia: Serum potassium level improved.  #Transaminitis: Worsening.  Follow-up MRCP and consultants plan and recommendation.  Repeat lab.  #Possible urinary tract infection: Continue subtraction.  Follow-up culture.  #Hypothyroidism: Continue Synthroid when able to take orally.  #Hypertension: On diltiazem.  Monitor blood pressure.  Holding diuretics.  DVT prophylaxis: SCDs Code Status: Full code Family Communication: No family at bedside Disposition Plan: Admitted    Consultants:   GI  Surgery  Procedures: MRCP, result pending Antimicrobials: Ceftriaxone  Subjective: Seen and examined at bedside.  Has nausea but no vomiting.  Abdominal pain is better.  No chest pain or shortness of breath.  Objective: Vitals:   09/08/17 2225 09/09/17 0439 09/09/17 0500 09/09/17 0939  BP: 140/69 108/64  (!) 107/58  Pulse: 63 (!) 55  (!) 53  Resp: 16 16  18   Temp: 97.9 F (36.6 C) 98 F (36.7 C)  97.9 F (36.6 C)   TempSrc: Oral Oral  Oral  SpO2: 100% 98%  98%  Weight: 73.8 kg (162 lb 12.8 oz)     Height:   4\' 11"  (1.499 m)     Intake/Output Summary (Last 24 hours) at 09/09/2017 1025 Last data filed at 09/09/2017 0900 Gross per 24 hour  Intake 951.25 ml  Output 700 ml  Net 251.25 ml   Filed Weights   09/08/17 2225  Weight: 73.8 kg (162 lb 12.8 oz)    Examination:  General exam: Appears calm and comfortable  Respiratory system: Clear to auscultation. Respiratory effort normal. No wheezing or crackle Cardiovascular system: S1 & S2 heard, RRR.  No pedal edema. Gastrointestinal system: Abdomen is nondistended, soft and right upper quadrant mild tenderness. Normal bowel sounds heard. Central nervous system: Alert and oriented. No focal neurological deficits. Extremities: Symmetric 5 x 5 power. Skin: No rashes, lesions or ulcers Psychiatry: Judgement and insight appear normal. Mood & affect appropriate.     Data Reviewed: I have personally reviewed following labs and imaging studies  CBC: Recent Labs  Lab 09/08/17 1628 09/09/17 0540  WBC 13.3* 5.7  HGB 13.5 13.0  HCT 40.6 40.1  MCV 81.0 81.5  PLT 309 314   Basic Metabolic Panel: Recent Labs  Lab 09/08/17 1628 09/09/17 0540  NA 134* 137  K 3.2* 4.2  CL 99* 102  CO2 24 26  GLUCOSE 152* 100*  BUN 15 12  CREATININE 1.25* 0.94  CALCIUM 9.3 9.1   GFR: Estimated Creatinine Clearance: 53.6 mL/min (by C-G formula based on SCr of 0.94 mg/dL). Liver Function Tests: Recent Labs  Lab 09/08/17 1628 09/09/17 0540  AST 181* 612*  ALT 142* 632*  ALKPHOS 230* 309*  BILITOT 0.9 1.0  PROT 7.4 7.0  ALBUMIN 4.0 3.7   Recent Labs  Lab 09/08/17 1628  LIPASE 44   No results for input(s): AMMONIA in the last 168 hours. Coagulation Profile: Recent Labs  Lab 09/09/17 0540  INR 0.93   Cardiac Enzymes: No results for input(s): CKTOTAL, CKMB, CKMBINDEX, TROPONINI in the last 168 hours. BNP (last 3 results) No results for  input(s): PROBNP in the last 8760 hours. HbA1C: No results for input(s): HGBA1C in the last 72 hours. CBG: No results for input(s): GLUCAP in the last 168 hours. Lipid Profile: No results for input(s): CHOL, HDL, LDLCALC, TRIG, CHOLHDL, LDLDIRECT in the last 72 hours. Thyroid Function Tests: Recent Labs    09/09/17 0540  TSH 3.113   Anemia Panel: No results for input(s): VITAMINB12, FOLATE, FERRITIN, TIBC, IRON, RETICCTPCT in the last 72 hours. Sepsis Labs: No results for input(s): PROCALCITON, LATICACIDVEN in the last 168 hours.  No results found for this or any previous visit (from the past 240 hour(s)).       Radiology Studies: Dg Chest 2 View  Result Date: 09/09/2017 CLINICAL DATA:  Nausea and vomiting today. EXAM: CHEST  2 VIEW COMPARISON:  10/10/2013. FINDINGS: The heart size and mediastinal contours are within normal limits. Aortic atherosclerosis at the arch. No aneurysm. Both lungs are clear. The visualized skeletal structures are unremarkable. IMPRESSION: No active cardiopulmonary disease.  Aortic atherosclerosis. Electronically Signed   By: Tollie Ethavid  Kwon M.D.   On: 09/09/2017 01:14   Mr 3d Recon At Scanner  Result Date: 09/09/2017 CLINICAL DATA:  Elevated LFTs.Gallstones. Increase caliber of the common bile duct EXAM: MRI ABDOMEN WITHOUT AND WITH CONTRAST (INCLUDING MRCP) TECHNIQUE: Multiplanar multisequence MR imaging of the abdomen was performed both before and after the administration of intravenous contrast. Heavily T2-weighted images of the biliary and pancreatic ducts were obtained, and three-dimensional MRCP images were rendered by post processing. CONTRAST:  14mL MULTIHANCE GADOBENATE DIMEGLUMINE 529 MG/ML IV SOLN COMPARISON:  09/08/2017 FINDINGS: Lower chest: No acute findings. Hepatobiliary: No focal liver abnormality. Mild intrahepatic biliary dilatation. There is diffuse hepatic steatosis. Stones identified within the lumen of the gallbladder. These measure up to  2 cm. Increase caliber of the CBD which measures 7 mm in maximum diameter. There is a questionable tiny stone within the distal CBD measuring 4 mm, image 17 of series 4 and image 24 of series 7. Pancreas: No pancreatic ductal dilatation or inflammation. No mass. Spleen:  Within normal limits in size and appearance. Adrenals/Urinary Tract: No masses identified. No evidence of hydronephrosis. Small right kidney cysts measures 8 mm. Stomach/Bowel: Visualized portions within the abdomen are unremarkable. Vascular/Lymphatic: No pathologically enlarged lymph nodes identified. No abdominal aortic aneurysm demonstrated. Other:  None. Musculoskeletal: No suspicious bone lesions identified. IMPRESSION: 1. Gallstones. The common bile duct is increased in caliber and there is a an equivocal filling defect within the distal CBD which may represent a small 4 mm common bile duct stone. 2. Hepatic steatosis. Electronically Signed   By: Signa Kellaylor  Stroud M.D.   On: 09/09/2017 10:20   Mr Abdomen Mrcp W WUWo Contast  Result Date: 09/09/2017 CLINICAL DATA:  Elevated LFTs.Gallstones. Increase caliber of the common bile duct EXAM: MRI ABDOMEN WITHOUT AND WITH CONTRAST (INCLUDING MRCP) TECHNIQUE: Multiplanar multisequence MR imaging of the abdomen was performed both before and after the administration of intravenous contrast. Heavily T2-weighted images of the biliary and pancreatic ducts were obtained, and three-dimensional MRCP images were rendered by  post processing. CONTRAST:  14mL MULTIHANCE GADOBENATE DIMEGLUMINE 529 MG/ML IV SOLN COMPARISON:  09/08/2017 FINDINGS: Lower chest: No acute findings. Hepatobiliary: No focal liver abnormality. Mild intrahepatic biliary dilatation. There is diffuse hepatic steatosis. Stones identified within the lumen of the gallbladder. These measure up to 2 cm. Increase caliber of the CBD which measures 7 mm in maximum diameter. There is a questionable tiny stone within the distal CBD measuring 4 mm,  image 17 of series 4 and image 24 of series 7. Pancreas: No pancreatic ductal dilatation or inflammation. No mass. Spleen:  Within normal limits in size and appearance. Adrenals/Urinary Tract: No masses identified. No evidence of hydronephrosis. Small right kidney cysts measures 8 mm. Stomach/Bowel: Visualized portions within the abdomen are unremarkable. Vascular/Lymphatic: No pathologically enlarged lymph nodes identified. No abdominal aortic aneurysm demonstrated. Other:  None. Musculoskeletal: No suspicious bone lesions identified. IMPRESSION: 1. Gallstones. The common bile duct is increased in caliber and there is a an equivocal filling defect within the distal CBD which may represent a small 4 mm common bile duct stone. 2. Hepatic steatosis. Electronically Signed   By: Signa Kell M.D.   On: 09/09/2017 10:20   US Abdomen Limited Ruq  Result Date: 09/08/2017 CLINICAL DATA:  Abdominal pain. EXAM: ULTRASOUND ABDOMEN LIMITED RIGHT UPPER QUADRANT COMPARISON:  None. FINDINGS: Gallbladder: Stones and sludge filled gallbladder with no wall thickening, pericholecystic fluid, or Murphy's sign. Common bile duct: Diameter: 12 mm today versus 14 mm previously. The distal duct is not seen due to shadowing bowel gas. Liver: Probable hepatic steatosis. No focal mass. Portal vein is patent on color Doppler imaging with normal direction of blood flow towards the liver. IMPRESSION: 1. Stones and sludge filled gallbladder without wall thickening, pericholecystic fluid, or Murphy's sign. 2. The common bile duct is dilated measuring 12 mm today versus 14 mm previously. This finding is concerning for common bile duct obstruction. An MRCP could better evaluate. Electronically Signed   By: Gerome Sam [name] M.D   On: 09/08/2017 19:17        Scheduled Meds: . ALPRAZolam  1 mg Oral BID  . buPROPion  300 mg Oral Daily  . diltiazem  240 mg Oral Daily  . levothyroxine  100 mcg Oral QAC breakfast  . meloxicam  15 mg  Oral Daily  . pantoprazole (PROTONIX) IV  40 mg Intravenous Q24H   Continuous Infusions: . cefTRIAXone (ROCEPHIN)  IV Stopped (09/09/17 0302)  . dextrose 5 % and 0.9 % NaCl with KCl 20 mEq/L       LOS: 1 day    Rayya Yagi Jaynie Collins, MD [facility] Pager [phone]  If 7PM-7AM, please contact night-coverage [email or web address] Password [email or web address] 09/09/2017, 10:25 AM

## 2017-09-09 NOTE — Progress Notes (Signed)
Patient went to MRCP in MRI and did not return to room till approximately 0050.  Patient c/o being hungry.  Dr. Selena BattenKim made aware and stated ok for patient to have broth and something to drink then NPO.  Patient given a cup of chicken broth and one ginger ale which was consumed at 0115.  Patient made NPO after that.  Will continue to monitor.  Bernie CoveyKimberly Adreena Willits RN-BC, [employer]

## 2017-09-09 NOTE — Progress Notes (Signed)
New Admission Note:   Arrival Method:  Via stretcher from the ED Mental Orientation:  A & O x 4 Telemetry: Placed on Tele #[id number] Assessment: Completed Skin:  Dry but Intact IV:  RFA PIV Pain: See Assessment Tubes:  None Safety Measures: Safety Fall Prevention Plan has been given, discussed and signed Admission: Completed [address] [place] Orientation: Patient has been orientated to the room, unit and staff.  Family:  Friend at bedside  Orders have been reviewed and implemented. Will continue to monitor the patient. Call light has been placed within reach and bed alarm has been activated.   Bernie CoveyKimberly Cinzia Devos RN- BC, [facility] Phone number: [phone]

## 2017-09-10 ENCOUNTER — Inpatient Hospital Stay (HOSPITAL_COMMUNITY): Payer: Medicare Other | Admitting: Anesthesiology

## 2017-09-10 ENCOUNTER — Encounter (HOSPITAL_COMMUNITY)
Admission: EM | Disposition: A | Discharge status: Home / Self Care | Payer: Self-pay | Source: Home / Self Care | Attending: Nephrology

## 2017-09-10 ENCOUNTER — Inpatient Hospital Stay (HOSPITAL_COMMUNITY): Payer: Medicare Other

## 2017-09-10 ENCOUNTER — Encounter (HOSPITAL_COMMUNITY): Payer: Self-pay | Admitting: *Deleted

## 2017-09-10 DIAGNOSIS — K8050 Calculus of bile duct without cholangitis or cholecystitis without obstruction: Secondary | ICD-10-CM

## 2017-09-10 DIAGNOSIS — K8070 Calculus of gallbladder and bile duct without cholecystitis without obstruction: Secondary | ICD-10-CM

## 2017-09-10 DIAGNOSIS — K831 Obstruction of bile duct: Secondary | ICD-10-CM

## 2017-09-10 HISTORY — PX: ERCP: SHX5425

## 2017-09-10 LAB — COMPREHENSIVE METABOLIC PANEL
ALT: 295 U/L — AB (ref 14–54)
AST: 118 U/L — AB (ref 15–41)
Albumin: 3.1 g/dL — ABNORMAL LOW (ref 3.5–5.0)
Alkaline Phosphatase: 218 U/L — ABNORMAL HIGH (ref 38–126)
Anion gap: 6 (ref 5–15)
BILIRUBIN TOTAL: 0.5 mg/dL (ref 0.3–1.2)
BUN: 7 mg/dL (ref 6–20)
CALCIUM: 8.9 mg/dL (ref 8.9–10.3)
CHLORIDE: 109 mmol/L (ref 101–111)
CO2: 25 mmol/L (ref 22–32)
CREATININE: 0.94 mg/dL (ref 0.44–1.00)
Glucose, Bld: 122 mg/dL — ABNORMAL HIGH (ref 65–99)
Potassium: 3.9 mmol/L (ref 3.5–5.1)
Sodium: 140 mmol/L (ref 135–145)
TOTAL PROTEIN: 5.8 g/dL — AB (ref 6.5–8.1)

## 2017-09-10 LAB — CBC
HEMATOCRIT: 36.5 % (ref 36.0–46.0)
Hemoglobin: 11.6 g/dL — ABNORMAL LOW (ref 12.0–15.0)
MCH: 26.2 pg (ref 26.0–34.0)
MCHC: 31.8 g/dL (ref 30.0–36.0)
MCV: 82.4 fL (ref 78.0–100.0)
PLATELETS: 282 10*3/uL (ref 150–400)
RBC: 4.43 MIL/uL (ref 3.87–5.11)
RDW: 14.2 % (ref 11.5–15.5)
WBC: 6.2 10*3/uL (ref 4.0–10.5)

## 2017-09-10 SURGERY — ERCP, WITH INTERVENTION IF INDICATED
Anesthesia: General

## 2017-09-10 MED ORDER — HYDROMORPHONE HCL 1 MG/ML IJ SOLN
0.5000 mg | INTRAMUSCULAR | Status: DC | PRN
  Administered 2017-09-11 (×2): 0.5 mg via INTRAVENOUS
  Filled 2017-09-10 (×2): qty 0.5

## 2017-09-10 MED ORDER — INDOMETHACIN 50 MG RE SUPP
RECTAL | Status: DC | PRN
  Administered 2017-09-10: 100 mg via RECTAL

## 2017-09-10 MED ORDER — ROCURONIUM BROMIDE 100 MG/10ML IV SOLN
INTRAVENOUS | Status: DC | PRN
  Administered 2017-09-10: 50 mg via INTRAVENOUS

## 2017-09-10 MED ORDER — DEXAMETHASONE SODIUM PHOSPHATE 10 MG/ML IJ SOLN
INTRAMUSCULAR | Status: DC | PRN
  Administered 2017-09-10: 10 mg via INTRAVENOUS

## 2017-09-10 MED ORDER — FENTANYL CITRATE (PF) 100 MCG/2ML IJ SOLN
INTRAMUSCULAR | Status: DC | PRN
  Administered 2017-09-10: 50 ug via INTRAVENOUS

## 2017-09-10 MED ORDER — LACTATED RINGERS IV SOLN
INTRAVENOUS | Status: DC | PRN
  Administered 2017-09-10: 12:00:00 via INTRAVENOUS

## 2017-09-10 MED ORDER — GLYCOPYRROLATE 0.2 MG/ML IJ SOLN
INTRAMUSCULAR | Status: DC | PRN
  Administered 2017-09-10: 0.4 mg via INTRAVENOUS

## 2017-09-10 MED ORDER — MIDAZOLAM HCL 5 MG/5ML IJ SOLN
INTRAMUSCULAR | Status: DC | PRN
  Administered 2017-09-10: 2 mg via INTRAVENOUS

## 2017-09-10 MED ORDER — SUGAMMADEX SODIUM 200 MG/2ML IV SOLN
INTRAVENOUS | Status: DC | PRN
  Administered 2017-09-10: 400 mg via INTRAVENOUS

## 2017-09-10 MED ORDER — PHENYLEPHRINE HCL 10 MG/ML IJ SOLN
INTRAVENOUS | Status: DC | PRN
  Administered 2017-09-10: 25 ug/min via INTRAVENOUS

## 2017-09-10 MED ORDER — LIDOCAINE HCL (CARDIAC) 20 MG/ML IV SOLN
INTRAVENOUS | Status: DC | PRN
  Administered 2017-09-10: 60 mg via INTRAVENOUS

## 2017-09-10 MED ORDER — IOPAMIDOL (ISOVUE-300) INJECTION 61%
INTRAVENOUS | Status: AC
  Filled 2017-09-10: qty 50

## 2017-09-10 MED ORDER — INDOMETHACIN 50 MG RE SUPP: 100.0000 mg | Freq: Once | RECTAL | Status: DC

## 2017-09-10 MED ORDER — INDOMETHACIN 50 MG RE SUPP
RECTAL | Status: AC
  Filled 2017-09-10: qty 2

## 2017-09-10 MED ORDER — ONDANSETRON HCL 4 MG/2ML IJ SOLN
INTRAMUSCULAR | Status: DC | PRN
  Administered 2017-09-10: 4 mg via INTRAVENOUS

## 2017-09-10 MED ORDER — PROPOFOL 10 MG/ML IV BOLUS
INTRAVENOUS | Status: DC | PRN
  Administered 2017-09-10: 150 mg via INTRAVENOUS

## 2017-09-10 MED ORDER — IOPAMIDOL (ISOVUE-300) INJECTION 61%
INTRAVENOUS | Status: DC | PRN
  Administered 2017-09-10: 30 mL

## 2017-09-10 MED ORDER — GLUCAGON HCL RDNA (DIAGNOSTIC) 1 MG IJ SOLR
INTRAMUSCULAR | Status: AC
  Filled 2017-09-10: qty 1

## 2017-09-10 NOTE — Progress Notes (Signed)
[facility]Nehal Witting Progress Note    Subjective: Patient awaiting ERCP.  Tomorrow should be able to go to the OR.  Objective: Vital signs in last 24 hours: Temp:  [98 F (36.7 C)-98.3 F (36.8 C)] 98.2 F (36.8 C) (12/17 0550) Pulse Rate:  [50-60] 50 (12/17 0550) Resp:  [16-17] 16 (12/17 0550) BP: (99-126)/(53-62) 99/53 (12/17 0550) SpO2:  [94 %-98 %] 94 % (12/17 0550) Weight:  [166 lb 11.2 oz (75.6 kg)] 166 lb 11.2 oz (75.6 kg) (12/16 2021) Last BM Date: 09/06/17  Intake/Output from previous day: 12/16 0701 - 12/17 0700 In: 5226.3 [P.O.:600; I.V.:4476.3; IV Piggyback:50] Out: 1950 [Urine:1950] Intake/Output this shift: No intake/output data recorded.  General: No acute distress  Lungs: Clear  Abd: Benign  Extremities: No changes.  No DVT signs or symptoms  Neuro: Intact  Lab Results:  @LABLAST2 (wbc:2,hgb:2,hct:2,plt:2) BMET ) Recent Labs    09/09/17 0540 09/10/17 0556  NA 137 140  K 4.2 3.9  CL 102 109  CO2 26 25  GLUCOSE 100* 122*  BUN 12 7  CREATININE 0.94 0.94  CALCIUM 9.1 8.9   PT/INR Recent Labs    09/09/17 0540  LABPROT 12.4  INR 0.93   ABG No results for input(s): PHART, HCO3 in the last 72 hours.  Invalid input(s): PCO2, PO2  Studies/Results: Dg Chest 2 View  Result Date: 09/09/2017 CLINICAL DATA:  Nausea and vomiting today. EXAM: CHEST  2 VIEW COMPARISON:  10/10/2013. FINDINGS: The heart size and mediastinal contours are within normal limits. Aortic atherosclerosis at the arch. No aneurysm. Both lungs are clear. The visualized skeletal structures are unremarkable. IMPRESSION: No active cardiopulmonary disease.  Aortic atherosclerosis. Electronically Signed   By: Tollie Ethavid  Kwon M.D.   On: 09/09/2017 01:14   Mr 3d Recon At Scanner  Result Date: 09/09/2017 CLINICAL DATA:  Elevated LFTs.Gallstones. Increase caliber of the common bile duct EXAM: MRI ABDOMEN WITHOUT AND WITH CONTRAST (INCLUDING MRCP) TECHNIQUE: Multiplanar multisequence MR imaging of  the abdomen was performed both before and after the administration of intravenous contrast. Heavily T2-weighted images of the biliary and pancreatic ducts were obtained, and three-dimensional MRCP images were rendered by post processing. CONTRAST:  14mL MULTIHANCE GADOBENATE DIMEGLUMINE 529 MG/ML IV SOLN COMPARISON:  09/08/2017 FINDINGS: Lower chest: No acute findings. Hepatobiliary: No focal liver abnormality. Mild intrahepatic biliary dilatation. There is diffuse hepatic steatosis. Stones identified within the lumen of the gallbladder. These measure up to 2 cm. Increase caliber of the CBD which measures 7 mm in maximum diameter. There is a questionable tiny stone within the distal CBD measuring 4 mm, image 17 of series 4 and image 24 of series 7. Pancreas: No pancreatic ductal dilatation or inflammation. No mass. Spleen:  Within normal limits in size and appearance. Adrenals/Urinary Tract: No masses identified. No evidence of hydronephrosis. Small right kidney cysts measures 8 mm. Stomach/Bowel: Visualized portions within the abdomen are unremarkable. Vascular/Lymphatic: No pathologically enlarged lymph nodes identified. No abdominal aortic aneurysm demonstrated. Other:  None. Musculoskeletal: No suspicious bone lesions identified. IMPRESSION: 1. Gallstones. The common bile duct is increased in caliber and there is a an equivocal filling defect within the distal CBD which may represent a small 4 mm common bile duct stone. 2. Hepatic steatosis. Electronically Signed   By: Signa Kellaylor  Stroud M.D.   On: 09/09/2017 10:20   Mr Abdomen Mrcp W ZOWo Contast  Result Date: 09/09/2017 CLINICAL DATA:  Elevated LFTs.Gallstones. Increase caliber of the common bile duct EXAM: MRI ABDOMEN WITHOUT AND WITH CONTRAST (INCLUDING MRCP)  TECHNIQUE: Multiplanar multisequence MR imaging of the abdomen was performed both before and after the administration of intravenous contrast. Heavily T2-weighted images of the biliary and pancreatic  ducts were obtained, and three-dimensional MRCP images were rendered by post processing. CONTRAST:  14mL MULTIHANCE GADOBENATE DIMEGLUMINE 529 MG/ML IV SOLN COMPARISON:  09/08/2017 FINDINGS: Lower chest: No acute findings. Hepatobiliary: No focal liver abnormality. Mild intrahepatic biliary dilatation. There is diffuse hepatic steatosis. Stones identified within the lumen of the gallbladder. These measure up to 2 cm. Increase caliber of the CBD which measures 7 mm in maximum diameter. There is a questionable tiny stone within the distal CBD measuring 4 mm, image 17 of series 4 and image 24 of series 7. Pancreas: No pancreatic ductal dilatation or inflammation. No mass. Spleen:  Within normal limits in size and appearance. Adrenals/Urinary Tract: No masses identified. No evidence of hydronephrosis. Small right kidney cysts measures 8 mm. Stomach/Bowel: Visualized portions within the abdomen are unremarkable. Vascular/Lymphatic: No pathologically enlarged lymph nodes identified. No abdominal aortic aneurysm demonstrated. Other:  None. Musculoskeletal: No suspicious bone lesions identified. IMPRESSION: 1. Gallstones. The common bile duct is increased in caliber and there is a an equivocal filling defect within the distal CBD which may represent a small 4 mm common bile duct stone. 2. Hepatic steatosis. Electronically Signed   By: Signa Kellaylor  Stroud M.D.   On: 09/09/2017 10:20   [place] Abdomen Limited Ruq  Result Date: 09/08/2017 CLINICAL DATA:  Abdominal pain. EXAM: ULTRASOUND ABDOMEN LIMITED RIGHT UPPER QUADRANT COMPARISON:  None. FINDINGS: Gallbladder: Stones and sludge filled gallbladder with no wall thickening, pericholecystic fluid, or Murphy's sign. Common bile duct: Diameter: 12 mm today versus 14 mm previously. The distal duct is not seen due to shadowing bowel gas. Liver: Probable hepatic steatosis. No focal mass. Portal vein is patent on color Doppler imaging with normal direction of blood flow towards the  liver. IMPRESSION: 1. Stones and sludge filled gallbladder without wall thickening, pericholecystic fluid, or Murphy's sign. 2. The common bile duct is dilated measuring 12 mm today versus 14 mm previously. This finding is concerning for common bile duct obstruction. An MRCP could better evaluate. Electronically Signed   By: Gerome Samavid  Williams [name] M.D   On: 09/08/2017 19:17    Anti-infectives: Anti-infectives (From admission, onward)   Start     Dose/Rate Route Frequency Ordered Stop   09/08/17 2300  cefTRIAXone (ROCEPHIN) 1 g in dextrose 5 % 50 mL IVPB     1 g 100 mL/hr over 30 Minutes Intravenous Every 24 hours 09/08/17 2111        Assessment/Plan: s/p Procedure(s): ENDOSCOPIC RETROGRADE CHOLANGIOPANCREATOGRAPHY (ERCP) NPO after midnight for surgery tomorrow.  LOS: 2 days   [name], [name], MD, FACS [phone]--pager [phone]--office [facility] 09/10/2017

## 2017-09-10 NOTE — Anesthesia Procedure Notes (Signed)
Procedure Name: Intubation Date/Time: 09/10/2017 12:15 PM Performed by: Kyung Rudd, CRNA Pre-anesthesia Checklist: Patient identified, Emergency Drugs available, Suction available and Patient being monitored Patient Re-evaluated:Patient Re-evaluated prior to induction Oxygen Delivery Method: Circle system utilized Preoxygenation: Pre-oxygenation with 100% oxygen Induction Type: IV induction Ventilation: Mask ventilation without difficulty Laryngoscope Size: Mac and 3 Grade View: Grade II Tube type: Oral Tube size: 7.0 mm Number of attempts: 1 Airway Equipment and Method: Stylet Placement Confirmation: ETT inserted through vocal cords under direct vision,  positive ETCO2 and breath sounds checked- equal and bilateral Secured at: 21 cm Tube secured with: Tape Dental Injury: Teeth and Oropharynx as per pre-operative assessment

## 2017-09-10 NOTE — Anesthesia Postprocedure Evaluation (Signed)
Anesthesia Post Note  Patient: Brooke Mccarthy  Procedure(s) Performed: ENDOSCOPIC RETROGRADE CHOLANGIOPANCREATOGRAPHY (ERCP) (N/A )     Patient location during evaluation: Endoscopy Anesthesia Type: General Level of consciousness: awake and alert Pain management: pain level controlled Vital Signs Assessment: post-procedure vital signs reviewed and stable Respiratory status: spontaneous breathing, nonlabored ventilation, respiratory function stable and patient connected to nasal cannula oxygen Cardiovascular status: blood pressure returned to baseline and stable Postop Assessment: no apparent nausea or vomiting Anesthetic complications: no    Last Vitals:  Vitals:   09/10/17 1350 09/10/17 1425  BP: (!) 141/57 135/72  Pulse: 64 60  Resp: 16 17  Temp:    SpO2: 98% 100%    Last Pain:  Vitals:   09/10/17 1314  TempSrc: Oral  PainSc:                  Aarik Blank,JAMES TERRILL

## 2017-09-10 NOTE — Anesthesia Preprocedure Evaluation (Addendum)
Anesthesia Evaluation  Patient identified by MRN, date of birth, ID band Patient awake    Reviewed: Allergy & Precautions, NPO status , Patient's Chart, lab work & pertinent test results  History of Anesthesia Complications (+) PONV  Airway Mallampati: II  TM Distance: >3 FB Neck ROM: Full    Dental  (+) Partial Upper, Partial Lower   Pulmonary Current Smoker,    breath sounds clear to auscultation       Cardiovascular hypertension,  Rhythm:Regular Rate:Normal     Neuro/Psych    GI/Hepatic GERD  ,  Endo/Other  Hypothyroidism   Renal/GU      Musculoskeletal  (+) Arthritis ,   Abdominal   Peds  Hematology   Anesthesia Other Findings   Reproductive/Obstetrics                             Anesthesia Physical Anesthesia Plan  ASA: [name]  Anesthesia Plan: General   Post-op Pain Management:    Induction: Intravenous  PONV Risk Score and Plan: 3 and Treatment may vary due to age or medical condition, Ondansetron and Dexamethasone  Airway Management Planned: Oral ETT  Additional Equipment:   Intra-op Plan:   Post-operative Plan: Extubation in OR  Informed Consent: I have reviewed the patients History and Physical, chart, labs and discussed the procedure including the risks, benefits and alternatives for the proposed anesthesia with the patient or authorized representative who has indicated his/her understanding and acceptance.   Dental advisory given  Plan Discussed with: CRNA  Anesthesia Plan Comments:         Anesthesia Quick Evaluation

## 2017-09-10 NOTE — Progress Notes (Signed)
   The patient was seen prior to ERCP and anesthesia  Heart, lung and abdominal exam were normal except mild epigastric tenderness.  VS reviewed, meds and prior notes and MRCP images were reviewed.  The risks and benefits as well as alternatives of endoscopic procedure(s) have been discussed and reviewed. All questions answered. The patient agrees to proceed.  Iva Booparl E. Gessner, MD, Clementeen GrahamFACG

## 2017-09-10 NOTE — Progress Notes (Signed)
Initial Nutrition Assessment  DOCUMENTATION CODES:   Not applicable  INTERVENTION:   -Await diet progression following procedure.  -If po intake inadequate on follow-up, recommend initiation of nutrition    NUTRITION DIAGNOSIS:   Inadequate oral intake related to altered GI function as evidenced by NPO status.  GOAL:   Patient will meet greater than or equal to 90% of their needs  MONITOR:   PO intake, Supplement acceptance, Labs, Weight trends  REASON FOR ASSESSMENT:   Malnutrition Screening Tool    ASSESSMENT:   61 yo female admitted with RUQ pain, cholelithiasis and dilated common bile duct. Pt with hx of HTN, HLD, GERD  MRCP with distal common bile duct stone, ERCP today  Recorded po intake 25-100% on [place] diet. Currently NPO  No previous weight encounters.   Labs: reviewed Meds: D5-NS at 75 ml/hr  NUTRITION - FOCUSED PHYSICAL EXAM:  Unable to assess, pt not in room  Diet Order:  Diet NPO time specified Diet clear liquid Room service appropriate? Yes; Fluid consistency: Thin  EDUCATION NEEDS:   Not appropriate for education at this time  Skin:  Skin Assessment: Reviewed RN Assessment  Last BM:  12/13  Height:   Ht Readings from Last 1 Encounters:  09/09/17 4\' 11"  (1.499 m)    Weight:   Wt Readings from Last 1 Encounters:  09/09/17 166 lb 11.2 oz (75.6 kg)    Ideal Body Weight:     BMI:  Body mass index is 33.67 kg/m.  Estimated Nutritional Needs:   Kcal:  1300-1500 kcals  Protein:  75-94 g  Fluid:  >/= 1.5 L   Brooke Starcherate Desmon Hitchner MS, RD, LDN, CNSC [phone] Pager  [phone] Weekend/On-Call Pager

## 2017-09-10 NOTE — Transfer of Care (Signed)
Immediate Anesthesia Transfer of Care Note  Patient: Brooke Mccarthy  Procedure(s) Performed: ENDOSCOPIC RETROGRADE CHOLANGIOPANCREATOGRAPHY (ERCP) (N/A )  Patient Location: Endoscopy Unit  Anesthesia Type:General  Level of Consciousness: awake, alert  and oriented  Airway & Oxygen Therapy: Patient Spontanous Breathing and Patient connected to nasal cannula oxygen  Post-op Assessment: Report given to RN, Post -op Vital signs reviewed and stable and Patient moving all extremities X 4  Post vital signs: Reviewed and stable  Last Vitals:  Vitals:   09/10/17 1058 09/10/17 1128  BP: 123/65 90/62  Pulse: (!) 49 (!) 45  Resp: 17 20  Temp: (!) 36.4 C 36.7 C  SpO2: 98% 97%    Last Pain:  Vitals:   09/10/17 1128  TempSrc: Oral  PainSc:       Patients Stated Pain Goal: 2 (09/09/17 2000)  Complications: No apparent anesthesia complications

## 2017-09-10 NOTE — Progress Notes (Signed)
PROGRESS NOTE    Brooke Mccarthy  ZOX:[id number] DOB: [birth year] DOA: 09/08/2017 PCP: Barbie BannerWilson, Fred H, MD   Brief Narrative: 61 year old female with history of hypothyroidism, hypertension, hyperlipidemia, GERD presented with right upper quadrant pain for 1 day.  Patient with nausea vomiting and nonbloody emesis.  Right upper quadrant consistent with cholelithiasis, dilated bile duct 12 mm concerning for common bile duct obstruction.  Admitted for further evaluation.  Assessment & Plan:  #Right upper quadrant pain, cholelithiasis and dilated common bile duct -MRCP with the distal common bile duct stone.  Plan for ERCP today by GI.  Possible cholecystectomy inpatient by general surgery.  Both GI and surgery consult appreciated.  Liver enzymes are trending down.  Patient is clinically improving.  Denied nausea vomiting or abdominal pain today.  Continue IV fluid and supportive care.  Currently n.p.o. for the procedure. - Protonix IV. -Zofran as needed for the management of nausea and vomiting.  #Hypokalemia: Serum potassium level improved.  #Transaminitis: Improving.  Monitor labs.  #Possible urinary tract infection: Continue ceftriaxone.  Urine culture ordered.  #Hypothyroidism: Continue Synthroid.  #Hypertension: On diltiazem.  Monitor blood pressure.  Continue to hold diuretics.  Blood pressure is borderline low while sleeping.  Patient is asymptomatic.  DVT prophylaxis: SCDs Code Status: Full code Family Communication: Patient's friend at bedside, he was sleeping Disposition Plan: Admitted    Consultants:   GI  Surgery  Procedures: MRCP,  Antimicrobials: Ceftriaxone  Subjective: Seen and examined at bedside.  Rested well.  Denied nausea vomiting chest pain shortness of breath.  No abdominal pain.  No bowel movement today.  Objective: Vitals:   09/09/17 0939 09/09/17 1702 09/09/17 2021 09/10/17 0550  BP: (!) 107/58 118/62 (!) 126/59 (!) 99/53  Pulse: (!) 53 (!) 55 60  (!) 50  Resp: 18 16 17 16   Temp: 97.9 F (36.6 C) 98 F (36.7 C) 98.3 F (36.8 C) 98.2 F (36.8 C)  TempSrc: Oral Oral Oral Oral  SpO2: 98% 98% 96% 94%  Weight:   75.6 kg (166 lb 11.2 oz)   Height:        Intake/Output Summary (Last 24 hours) at 09/10/2017 1002 Last data filed at 09/10/2017 0600 Gross per 24 hour  Intake 5226.25 ml  Output 1750 ml  Net 3476.25 ml   Filed Weights   09/08/17 2225 09/09/17 2021  Weight: 73.8 kg (162 lb 12.8 oz) 75.6 kg (166 lb 11.2 oz)    Examination:  General exam: lying on bed comfortable, not in distress. Respiratory system: Clear bilateral, respiratory effort normal.  No wheezing or crackle. Cardiovascular system: Regular rate rhythm S1-S2 normal.  No pedal edema. Gastrointestinal system: Abdomen soft, nontender, nondistended.  Bowel sounds positive.. Central nervous system: Alert awake and following commands. Skin: No rashes, lesions or ulcers Psychiatry: Judgement and insight appear normal.  Mood & affect appropriate.     Data Reviewed: I have personally reviewed following labs and imaging studies  CBC: Recent Labs  Lab 09/08/17 1628 09/09/17 0540 09/10/17 0556  WBC 13.3* 5.7 6.2  HGB 13.5 13.0 11.6*  HCT 40.6 40.1 36.5  MCV 81.0 81.5 82.4  PLT 309 314 282   Basic Metabolic Panel: Recent Labs  Lab 09/08/17 1628 09/09/17 0540 09/10/17 0556  NA 134* 137 140  K 3.2* 4.2 3.9  CL 99* 102 109  CO2 24 26 25   GLUCOSE 152* 100* 122*  BUN 15 12 7   CREATININE 1.25* 0.94 0.94  CALCIUM 9.3 9.1 8.9   GFR: Estimated  Creatinine Clearance: 54.3 mL/min (by C-G formula based on SCr of 0.94 mg/dL). Liver Function Tests: Recent Labs  Lab 09/08/17 1628 09/09/17 0540 09/10/17 0556  AST 181* 612* 118*  ALT 142* 632* 295*  ALKPHOS 230* 309* 218*  BILITOT 0.9 1.0 0.5  PROT 7.4 7.0 5.8*  ALBUMIN 4.0 3.7 3.1*   Recent Labs  Lab 09/08/17 1628  LIPASE 44   No results for input(s): AMMONIA in the last 168 hours. Coagulation  Profile: Recent Labs  Lab 09/09/17 0540  INR 0.93   Cardiac Enzymes: No results for input(s): CKTOTAL, CKMB, CKMBINDEX, TROPONINI in the last 168 hours. BNP (last 3 results) No results for input(s): PROBNP in the last 8760 hours. HbA1C: No results for input(s): HGBA1C in the last 72 hours. CBG: No results for input(s): GLUCAP in the last 168 hours. Lipid Profile: No results for input(s): CHOL, HDL, LDLCALC, TRIG, CHOLHDL, LDLDIRECT in the last 72 hours. Thyroid Function Tests: Recent Labs    09/09/17 0540  TSH 3.113   Anemia Panel: No results for input(s): VITAMINB12, FOLATE, FERRITIN, TIBC, IRON, RETICCTPCT in the last 72 hours. Sepsis Labs: No results for input(s): PROCALCITON, LATICACIDVEN in the last 168 hours.  No results found for this or any previous visit (from the past 240 hour(s)).       Radiology Studies: Dg Chest 2 View  Result Date: 09/09/2017 CLINICAL DATA:  Nausea and vomiting today. EXAM: CHEST  2 VIEW COMPARISON:  10/10/2013. FINDINGS: The heart size and mediastinal contours are within normal limits. Aortic atherosclerosis at the arch. No aneurysm. Both lungs are clear. The visualized skeletal structures are unremarkable. IMPRESSION: No active cardiopulmonary disease.  Aortic atherosclerosis. Electronically Signed   By: Tollie Ethavid  Kwon M.D.   On: 09/09/2017 01:14   Mr 3d Recon At Scanner  Result Date: 09/09/2017 CLINICAL DATA:  Elevated LFTs.Gallstones. Increase caliber of the common bile duct EXAM: MRI ABDOMEN WITHOUT AND WITH CONTRAST (INCLUDING MRCP) TECHNIQUE: Multiplanar multisequence MR imaging of the abdomen was performed both before and after the administration of intravenous contrast. Heavily T2-weighted images of the biliary and pancreatic ducts were obtained, and three-dimensional MRCP images were rendered by post processing. CONTRAST:  14mL MULTIHANCE GADOBENATE DIMEGLUMINE 529 MG/ML IV SOLN COMPARISON:  09/08/2017 FINDINGS: Lower chest: No acute  findings. Hepatobiliary: No focal liver abnormality. Mild intrahepatic biliary dilatation. There is diffuse hepatic steatosis. Stones identified within the lumen of the gallbladder. These measure up to 2 cm. Increase caliber of the CBD which measures 7 mm in maximum diameter. There is a questionable tiny stone within the distal CBD measuring 4 mm, image 17 of series 4 and image 24 of series 7. Pancreas: No pancreatic ductal dilatation or inflammation. No mass. Spleen:  Within normal limits in size and appearance. Adrenals/Urinary Tract: No masses identified. No evidence of hydronephrosis. Small right kidney cysts measures 8 mm. Stomach/Bowel: Visualized portions within the abdomen are unremarkable. Vascular/Lymphatic: No pathologically enlarged lymph nodes identified. No abdominal aortic aneurysm demonstrated. Other:  None. Musculoskeletal: No suspicious bone lesions identified. IMPRESSION: 1. Gallstones. The common bile duct is increased in caliber and there is a an equivocal filling defect within the distal CBD which may represent a small 4 mm common bile duct stone. 2. Hepatic steatosis. Electronically Signed   By: Signa Kellaylor  Stroud M.D.   On: 09/09/2017 10:20   Mr Abdomen Mrcp W IOWo Contast  Result Date: 09/09/2017 CLINICAL DATA:  Elevated LFTs.Gallstones. Increase caliber of the common bile duct EXAM: MRI ABDOMEN WITHOUT AND  WITH CONTRAST (INCLUDING MRCP) TECHNIQUE: Multiplanar multisequence MR imaging of the abdomen was performed both before and after the administration of intravenous contrast. Heavily T2-weighted images of the biliary and pancreatic ducts were obtained, and three-dimensional MRCP images were rendered by post processing. CONTRAST:  14mL MULTIHANCE GADOBENATE DIMEGLUMINE 529 MG/ML IV SOLN COMPARISON:  09/08/2017 FINDINGS: Lower chest: No acute findings. Hepatobiliary: No focal liver abnormality. Mild intrahepatic biliary dilatation. There is diffuse hepatic steatosis. Stones identified within  the lumen of the gallbladder. These measure up to 2 cm. Increase caliber of the CBD which measures 7 mm in maximum diameter. There is a questionable tiny stone within the distal CBD measuring 4 mm, image 17 of series 4 and image 24 of series 7. Pancreas: No pancreatic ductal dilatation or inflammation. No mass. Spleen:  Within normal limits in size and appearance. Adrenals/Urinary Tract: No masses identified. No evidence of hydronephrosis. Small right kidney cysts measures 8 mm. Stomach/Bowel: Visualized portions within the abdomen are unremarkable. Vascular/Lymphatic: No pathologically enlarged lymph nodes identified. No abdominal aortic aneurysm demonstrated. Other:  None. Musculoskeletal: No suspicious bone lesions identified. IMPRESSION: 1. Gallstones. The common bile duct is increased in caliber and there is a an equivocal filling defect within the distal CBD which may represent a small 4 mm common bile duct stone. 2. Hepatic steatosis. Electronically Signed   By: Signa Kell M.D.   On: 09/09/2017 10:20   US Abdomen Limited Ruq  Result Date: 09/08/2017 CLINICAL DATA:  Abdominal pain. EXAM: ULTRASOUND ABDOMEN LIMITED RIGHT UPPER QUADRANT COMPARISON:  None. FINDINGS: Gallbladder: Stones and sludge filled gallbladder with no wall thickening, pericholecystic fluid, or Murphy's sign. Common bile duct: Diameter: 12 mm today versus 14 mm previously. The distal duct is not seen due to shadowing bowel gas. Liver: Probable hepatic steatosis. No focal mass. Portal vein is patent on color Doppler imaging with normal direction of blood flow towards the liver. IMPRESSION: 1. Stones and sludge filled gallbladder without wall thickening, pericholecystic fluid, or Murphy's sign. 2. The common bile duct is dilated measuring 12 mm today versus 14 mm previously. This finding is concerning for common bile duct obstruction. An MRCP could better evaluate. Electronically Signed   By: Gerome Sam [name] M.D   On: 09/08/2017  19:17        Scheduled Meds: . ALPRAZolam  1 mg Oral BID  . buPROPion  300 mg Oral Daily  . diltiazem  240 mg Oral Daily  . levothyroxine  100 mcg Oral QAC breakfast  . pantoprazole (PROTONIX) IV  40 mg Intravenous Q24H   Continuous Infusions: . cefTRIAXone (ROCEPHIN)  IV Stopped (09/09/17 2321)  . dextrose 5 % and 0.9 % NaCl with KCl 20 mEq/L 75 mL/hr at 09/10/17 0246     LOS: 2 days    Morrill Bomkamp Jaynie Collins, MD [facility] Pager [phone]  If 7PM-7AM, please contact night-coverage [email or web address] Password [email or web address] 09/10/2017, 10:02 AM

## 2017-09-10 NOTE — Op Note (Signed)
[facility] Patient Name: Brooke Mccarthy Procedure Date : 09/10/2017 MRN: [id number] Attending MD: Iva Booparl E Aneliz Carbary , MD Date of Birth: [birth year] CSN: [id number] Age: [age] Admit Type: Inpatient Procedure:                ERCP Indications:              Bile duct stone(s) Providers:                Iva Booparl E. Javione Gunawan, MD, Dwain SarnaPatricia Ford, RN, Zoila ShutterGary                            Bryant, Technician Referring MD:              Medicines:                General Anesthesia, On ceftriaxone q 24 hrs Complications:            No immediate complications. Estimated Blood Loss:     Estimated blood loss: none. Procedure:                Pre-Anesthesia Assessment:                           - Prior to the procedure, a History and Physical                            was performed, and patient medications and                            allergies were reviewed. The patient's tolerance of                            previous anesthesia was also reviewed. The risks                            and benefits of the procedure and the sedation                            options and risks were discussed with the patient.                            All questions were answered, and informed consent                            was obtained. Prior Anticoagulants: The patient has                            taken no previous anticoagulant or antiplatelet                            agents. ASA Grade Assessment: II - A patient with                            mild systemic disease. After reviewing the risks  and benefits, the patient was deemed in                            satisfactory condition to undergo the procedure.                           After obtaining informed consent, the scope was                            passed under direct vision. Throughout the                            procedure, the patient's blood pressure, pulse, and                            oxygen saturations were  monitored continuously. The                            [id number] ([id number]) Pentax duodenoscope was                            introduced through the mouth, and used to inject                            contrast into and used to inject contrast into the                            bile duct. The ERCP was accomplished without                            difficulty. The patient tolerated the procedure                            well. Scope In: Scope Out: Findings:      The scout film was normal. The esophagus was successfully intubated       under direct vision. The scope was advanced to a normal major papilla in       the descending duodenum without detailed examination of the pharynx,       larynx and associated structures, and upper GI tract. The upper GI tract       was grossly normal. Esophagus not seen well. Papilla small size -       draining bile in usual position. Wire-duided cannulation - pancreas       entered w/ wire - that wire was left in place and then second wire used       to cannulate bile ducts. The pancreas wire was removed and contrast       injected into CBD/CHD and intrahepatics. The CBD and CHD were dilated to       12 mm w/o obvious stone and intrahepatics mildly dilated. Biliary       sphincterotomy performed with wire in place and then 12 mm balloon       inserted and swept bile duct and a 4-5 mm cholesterol stone was removed.       Subsequent sweep and occlusion cholangiogram were negative, the  gallbladder partially filled. No pancreatic injection by intent. Impression:               - Choledocholithiasis was found. Complete removal                            was accomplished by biliary sphincterotomy and                            balloon extraction. Recommendation:           - Return patient to hospital ward for ongoing care.                           - Clear liquid diet.                           - Lap chole tomorrow if doing ok. Procedure Code(s):         --- Professional ---                           [phone], Endoscopic retrograde                            cholangiopancreatography (ERCP); with removal of                            calculi/debris from biliary/pancreatic duct(s)                           43262, Endoscopic retrograde                            cholangiopancreatography (ERCP); with                            sphincterotomy/papillotomy Diagnosis Code(s):        --- Professional ---                           K80.50, Calculus of bile duct without cholangitis                            or cholecystitis without obstruction CPT copyright 2016 American [employer] Association. All rights reserved. The codes documented in this report are preliminary and upon coder review may  be revised to meet current compliance requirements. Iva Booparl E Dodd Schmid, MD 09/10/2017 1:06:40 PM This report has been signed electronically. Number of Addenda: 0

## 2017-09-11 ENCOUNTER — Encounter (HOSPITAL_COMMUNITY): Payer: Self-pay | Admitting: Anesthesiology

## 2017-09-11 ENCOUNTER — Encounter (HOSPITAL_COMMUNITY)
Admission: EM | Disposition: A | Discharge status: Home / Self Care | Payer: Self-pay | Source: Home / Self Care | Attending: Nephrology

## 2017-09-11 ENCOUNTER — Inpatient Hospital Stay (HOSPITAL_COMMUNITY): Payer: Medicare Other | Admitting: Anesthesiology

## 2017-09-11 HISTORY — PX: CHOLECYSTECTOMY: SHX55

## 2017-09-11 LAB — CBC
HCT: 38.4 % (ref 36.0–46.0)
Hemoglobin: 12.5 g/dL (ref 12.0–15.0)
MCH: 26.5 pg (ref 26.0–34.0)
MCHC: 32.6 g/dL (ref 30.0–36.0)
MCV: 81.4 fL (ref 78.0–100.0)
PLATELETS: 322 10*3/uL (ref 150–400)
RBC: 4.72 MIL/uL (ref 3.87–5.11)
RDW: 13.8 % (ref 11.5–15.5)
WBC: 11.5 10*3/uL — ABNORMAL HIGH (ref 4.0–10.5)

## 2017-09-11 LAB — COMPREHENSIVE METABOLIC PANEL
ALBUMIN: 3.9 g/dL (ref 3.5–5.0)
ALT: 226 U/L — ABNORMAL HIGH (ref 14–54)
ANION GAP: 8 (ref 5–15)
AST: 54 U/L — ABNORMAL HIGH (ref 15–41)
Alkaline Phosphatase: 246 U/L — ABNORMAL HIGH (ref 38–126)
BUN: 9 mg/dL (ref 6–20)
CALCIUM: 9.6 mg/dL (ref 8.9–10.3)
CHLORIDE: 106 mmol/L (ref 101–111)
CO2: 25 mmol/L (ref 22–32)
Creatinine, Ser: 1.01 mg/dL — ABNORMAL HIGH (ref 0.44–1.00)
GFR calc non Af Amer: 58 mL/min — ABNORMAL LOW (ref >60)
Glucose, Bld: 162 mg/dL — ABNORMAL HIGH (ref 65–99)
POTASSIUM: 3.9 mmol/L (ref 3.5–5.1)
SODIUM: 139 mmol/L (ref 135–145)
Total Bilirubin: 0.3 mg/dL (ref 0.3–1.2)
Total Protein: 7.1 g/dL (ref 6.5–8.1)

## 2017-09-11 LAB — SURGICAL PCR SCREEN
MRSA, PCR: NEGATIVE
STAPHYLOCOCCUS AUREUS: NEGATIVE

## 2017-09-11 SURGERY — LAPAROSCOPIC CHOLECYSTECTOMY
Anesthesia: General | Site: Abdomen

## 2017-09-11 MED ORDER — ROCURONIUM BROMIDE 100 MG/10ML IV SOLN
INTRAVENOUS | Status: DC | PRN
  Administered 2017-09-11: 50 mg via INTRAVENOUS

## 2017-09-11 MED ORDER — HYDROMORPHONE HCL 1 MG/ML IJ SOLN
0.2500 mg | INTRAMUSCULAR | Status: DC | PRN
  Administered 2017-09-11 (×2): 0.5 mg via INTRAVENOUS

## 2017-09-11 MED ORDER — BUPIVACAINE-EPINEPHRINE (PF) 0.25% -1:200000 IJ SOLN
INTRAMUSCULAR | Status: AC
  Filled 2017-09-11: qty 30

## 2017-09-11 MED ORDER — MIDAZOLAM HCL 5 MG/5ML IJ SOLN
INTRAMUSCULAR | Status: DC | PRN
  Administered 2017-09-11: 2 mg via INTRAVENOUS

## 2017-09-11 MED ORDER — DEXAMETHASONE SODIUM PHOSPHATE 10 MG/ML IJ SOLN
INTRAMUSCULAR | Status: DC | PRN
  Administered 2017-09-11: 10 mg via INTRAVENOUS

## 2017-09-11 MED ORDER — ONDANSETRON HCL 4 MG/2ML IJ SOLN
INTRAMUSCULAR | Status: AC
  Filled 2017-09-11: qty 2

## 2017-09-11 MED ORDER — SUGAMMADEX SODIUM 200 MG/2ML IV SOLN
INTRAVENOUS | Status: DC | PRN
  Administered 2017-09-11: 500 mg via INTRAVENOUS

## 2017-09-11 MED ORDER — PROPOFOL 10 MG/ML IV BOLUS
INTRAVENOUS | Status: DC | PRN
  Administered 2017-09-11: 150 mg via INTRAVENOUS

## 2017-09-11 MED ORDER — SODIUM CHLORIDE 0.9 % IR SOLN
Status: DC | PRN
  Administered 2017-09-11: 1000 mL

## 2017-09-11 MED ORDER — OXYCODONE HCL 5 MG/5ML PO SOLN: 5.0000 mg | Freq: Once | ORAL | Status: DC | PRN

## 2017-09-11 MED ORDER — MEPERIDINE HCL 25 MG/ML IJ SOLN: 6.2500 mg | INTRAMUSCULAR | Status: DC | PRN

## 2017-09-11 MED ORDER — LIDOCAINE 2% (20 MG/ML) 5 ML SYRINGE
INTRAMUSCULAR | Status: AC
  Filled 2017-09-11: qty 5

## 2017-09-11 MED ORDER — ONDANSETRON HCL 4 MG/2ML IJ SOLN
INTRAMUSCULAR | Status: DC | PRN
  Administered 2017-09-11: 4 mg via INTRAVENOUS

## 2017-09-11 MED ORDER — LACTATED RINGERS IV SOLN
INTRAVENOUS | Status: DC
  Administered 2017-09-11: 11:00:00 via INTRAVENOUS

## 2017-09-11 MED ORDER — FENTANYL CITRATE (PF) 100 MCG/2ML IJ SOLN
INTRAMUSCULAR | Status: DC | PRN
  Administered 2017-09-11 (×5): 50 ug via INTRAVENOUS

## 2017-09-11 MED ORDER — LIDOCAINE HCL (CARDIAC) 20 MG/ML IV SOLN
INTRAVENOUS | Status: DC | PRN
  Administered 2017-09-11: 60 mg via INTRAVENOUS

## 2017-09-11 MED ORDER — ROCURONIUM BROMIDE 10 MG/ML (PF) SYRINGE
PREFILLED_SYRINGE | INTRAVENOUS | Status: AC
  Filled 2017-09-11: qty 5

## 2017-09-11 MED ORDER — OXYCODONE HCL 5 MG PO TABS: 5.0000 mg | ORAL_TABLET | Freq: Once | ORAL | Status: DC | PRN

## 2017-09-11 MED ORDER — HYDROMORPHONE HCL 1 MG/ML IJ SOLN
INTRAMUSCULAR | Status: AC
Start: 2017-09-11 — End: 2017-09-12
  Filled 2017-09-11: qty 1

## 2017-09-11 MED ORDER — DEXAMETHASONE SODIUM PHOSPHATE 10 MG/ML IJ SOLN
INTRAMUSCULAR | Status: AC
  Filled 2017-09-11: qty 1

## 2017-09-11 MED ORDER — 0.9 % SODIUM CHLORIDE (POUR BTL) OPTIME
TOPICAL | Status: DC | PRN
  Administered 2017-09-11: 1000 mL

## 2017-09-11 MED ORDER — DOCUSATE SODIUM 100 MG PO CAPS
100.0000 mg | ORAL_CAPSULE | Freq: Two times a day (BID) | ORAL | Status: DC
  Administered 2017-09-12: 100 mg via ORAL
  Filled 2017-09-11: qty 1

## 2017-09-11 MED ORDER — PROPOFOL 10 MG/ML IV BOLUS
INTRAVENOUS | Status: AC
  Filled 2017-09-11: qty 20

## 2017-09-11 MED ORDER — SUGAMMADEX SODIUM 200 MG/2ML IV SOLN
INTRAVENOUS | Status: AC
  Filled 2017-09-11: qty 2

## 2017-09-11 MED ORDER — SUGAMMADEX SODIUM 500 MG/5ML IV SOLN
INTRAVENOUS | Status: AC
  Filled 2017-09-11: qty 5

## 2017-09-11 MED ORDER — DOCUSATE SODIUM 100 MG PO CAPS
200.0000 mg | ORAL_CAPSULE | Freq: Once | ORAL | Status: AC
  Administered 2017-09-12: 200 mg via ORAL
  Filled 2017-09-11: qty 2

## 2017-09-11 MED ORDER — FENTANYL CITRATE (PF) 250 MCG/5ML IJ SOLN
INTRAMUSCULAR | Status: AC
  Filled 2017-09-11: qty 5

## 2017-09-11 MED ORDER — OXYCODONE HCL 5 MG PO TABS
5.0000 mg | ORAL_TABLET | ORAL | Status: DC | PRN
  Administered 2017-09-12: 5 mg via ORAL
  Filled 2017-09-11: qty 1

## 2017-09-11 MED ORDER — MIDAZOLAM HCL 2 MG/2ML IJ SOLN
INTRAMUSCULAR | Status: AC
  Filled 2017-09-11: qty 2

## 2017-09-11 MED ORDER — BUPIVACAINE-EPINEPHRINE 0.25% -1:200000 IJ SOLN
INTRAMUSCULAR | Status: DC | PRN
  Administered 2017-09-11: 20 mL

## 2017-09-11 MED ORDER — PROMETHAZINE HCL 25 MG/ML IJ SOLN: 6.2500 mg | INTRAMUSCULAR | Status: DC | PRN

## 2017-09-11 SURGICAL SUPPLY — 38 items
ADH SKN CLS APL DERMABOND .7 (GAUZE/BANDAGES/DRESSINGS) ×1
APPLIER CLIP 5 13 M/L LIGAMAX5 (MISCELLANEOUS) ×3
APR CLP MED LRG 5 ANG JAW (MISCELLANEOUS) ×1
BAG SPEC RTRVL 10 TROC 200 (ENDOMECHANICALS)
BLADE CLIPPER SURG (BLADE) IMPLANT
CANISTER SUCT 3000ML PPV (MISCELLANEOUS) ×3 IMPLANT
CHLORAPREP W/TINT 26ML (MISCELLANEOUS) ×3 IMPLANT
CLIP APPLIE 5 13 M/L LIGAMAX5 (MISCELLANEOUS) ×1 IMPLANT
CLOSURE WOUND 1/2 X4 (GAUZE/BANDAGES/DRESSINGS) ×1
COVER SURGICAL LIGHT HANDLE (MISCELLANEOUS) ×3 IMPLANT
DERMABOND ADVANCED (GAUZE/BANDAGES/DRESSINGS) ×2
DERMABOND ADVANCED .7 DNX12 (GAUZE/BANDAGES/DRESSINGS) ×1 IMPLANT
DRSG TEGADERM 2-3/8X2-3/4 SM (GAUZE/BANDAGES/DRESSINGS) ×12 IMPLANT
ELECT REM PT RETURN 9FT ADLT (ELECTROSURGICAL) ×3
ELECTRODE REM PT RTRN 9FT ADLT (ELECTROSURGICAL) ×1 IMPLANT
GLOVE BIOGEL PI IND STRL 8 (GLOVE) ×1 IMPLANT
GLOVE BIOGEL PI INDICATOR 8 (GLOVE) ×2
GLOVE ECLIPSE 7.5 STRL STRAW (GLOVE) ×3 IMPLANT
GOWN STRL REUS W/ TWL LRG LVL3 (GOWN DISPOSABLE) ×3 IMPLANT
GOWN STRL REUS W/TWL LRG LVL3 (GOWN DISPOSABLE) ×9
KIT BASIN OR (CUSTOM PROCEDURE TRAY) ×3 IMPLANT
KIT ROOM TURNOVER OR (KITS) ×3 IMPLANT
NS IRRIG 1000ML POUR BTL (IV SOLUTION) ×3 IMPLANT
PAD ARMBOARD 7.5X6 YLW CONV (MISCELLANEOUS) ×3 IMPLANT
POUCH RETRIEVAL ECOSAC 10 (ENDOMECHANICALS) IMPLANT
POUCH RETRIEVAL ECOSAC 10MM (ENDOMECHANICALS)
SCISSORS LAP 5X35 DISP (ENDOMECHANICALS) ×3 IMPLANT
SET IRRIG TUBING LAPAROSCOPIC (IRRIGATION / IRRIGATOR) ×3 IMPLANT
SLEEVE ENDOPATH XCEL 5M (ENDOMECHANICALS) ×6 IMPLANT
SPECIMEN JAR SMALL (MISCELLANEOUS) ×3 IMPLANT
STRIP CLOSURE SKIN 1/2X4 (GAUZE/BANDAGES/DRESSINGS) ×2 IMPLANT
SUT MNCRL AB 4-0 PS2 18 (SUTURE) ×3 IMPLANT
TOWEL OR 17X24 6PK STRL BLUE (TOWEL DISPOSABLE) ×3 IMPLANT
TOWEL OR 17X26 10 PK STRL BLUE (TOWEL DISPOSABLE) ×3 IMPLANT
TRAY LAPAROSCOPIC MC (CUSTOM PROCEDURE TRAY) ×3 IMPLANT
TROCAR XCEL BLUNT TIP 100MML (ENDOMECHANICALS) ×3 IMPLANT
TROCAR XCEL NON-BLD 5MMX100MML (ENDOMECHANICALS) ×3 IMPLANT
TUBING INSUFFLATION (TUBING) ×3 IMPLANT

## 2017-09-11 NOTE — Discharge Instructions (Signed)
Please arrive at least 30 min before your appointment to complete your check in paperwork.  If you are unable to arrive 30 min prior to your appointment time we may have to cancel or reschedule you. ° °LAPAROSCOPIC SURGERY: POST OP INSTRUCTIONS  °1. DIET: Follow a light bland diet the first 24 hours after arrival home, such as soup, liquids, crackers, etc. Be sure to include lots of fluids daily. Avoid fast food or heavy meals as your are more likely to get nauseated. Eat a low fat the next few days after surgery.  °2. Take your usually prescribed home medications unless otherwise directed. °3. PAIN CONTROL:  °1. Pain is best controlled by a usual combination of three different methods TOGETHER:  °1. Ice/Heat °2. Over the counter pain medication °3. Prescription pain medication °2. Most patients will experience some swelling and bruising around the incisions. Ice packs or heating pads (30-60 minutes up to 6 times a day) will help. Use ice for the first few days to help decrease swelling and bruising, then switch to heat to help relax tight/sore spots and speed recovery. Some people prefer to use ice alone, heat alone, alternating between ice & heat. Experiment to what works for you. Swelling and bruising can take several weeks to resolve.  °3. It is helpful to take an over-the-counter pain medication regularly for the first few weeks. Choose one of the following that works best for you:  °1. Naproxen (Aleve, etc) Two 220mg tabs twice a day °2. Ibuprofen (Advil, etc) Three 200mg tabs four times a day (every meal & bedtime) °3. Acetaminophen (Tylenol, etc) 500-650mg four times a day (every meal & bedtime) °4. A prescription for pain medication (such as oxycodone, hydrocodone, etc) should be given to you upon discharge. Take your pain medication as prescribed.  °1. If you are having problems/concerns with the prescription medicine (does not control pain, nausea, vomiting, rash, itching, etc), please call us [phone]  [phone] to see if we need to switch you to a different pain medicine that will work better for you and/or control your side effect better. °2. If you need a refill on your pain medication, please contact your pharmacy. They will contact our office to request authorization. Prescriptions will not be filled after 5 pm or on week-ends. °4. Avoid getting constipated. Between the surgery and the pain medications, it is common to experience some constipation. Increasing fluid intake and taking a fiber supplement (such as Metamucil, Citrucel, FiberCon, MiraLax, etc) 1-2 times a day regularly will usually help prevent this problem from occurring. A mild laxative (prune juice, Milk of Magnesia, MiraLax, etc) should be taken according to package directions if there are no bowel movements after 48 hours.  °5. Watch out for diarrhea. If you have many loose bowel movements, simplify your diet to bland foods & liquids for a few days. Stop any stool softeners and decrease your fiber supplement. Switching to mild anti-diarrheal medications (Kayopectate, Pepto Bismol) can help. If this worsens or does not improve, please call us. °6. Wash / shower every day. You may shower over the dressings as they are waterproof. Continue to shower over incision(s) after the dressing is off. °7. Remove your waterproof bandages 5 days after surgery. You may leave the incision open to air. You may replace a dressing/Band-Aid to cover the incision for comfort if you wish.  °8. ACTIVITIES as tolerated:  °1. You may resume regular (light) daily activities beginning the next day--such as daily self-care, walking, climbing stairs--gradually   increasing activities as tolerated. If you can walk 30 minutes without difficulty, it is safe to try more intense activity such as jogging, treadmill, bicycling, low-impact aerobics, swimming, etc. °2. Save the most intensive and strenuous activity for last such as sit-ups, heavy lifting, contact sports, etc Refrain  from any heavy lifting or straining until you are off narcotics for pain control.  °3. DO NOT PUSH THROUGH PAIN. Let pain be your guide: If it hurts to do something, don't do it. Pain is your body warning you to avoid that activity for another week until the pain goes down. °4. You may drive when you are no longer taking prescription pain medication, you can comfortably wear a seatbelt, and you can safely maneuver your car and apply brakes. °5. You may have sexual intercourse when it is comfortable.  °9. FOLLOW UP in our office  °1. Please call [facility] at [phone] to set up an appointment to see your surgeon in the office for a follow-up appointment approximately 2-3 weeks after your surgery. °2. Make sure that you call for this appointment the day you arrive home to insure a convenient appointment time. °     10. IF YOU HAVE DISABILITY OR FAMILY LEAVE FORMS, BRING THEM TO THE               OFFICE FOR PROCESSING.  ° °WHEN TO CALL US [phone]:  °1. Poor pain control °2. Reactions / problems with new medications (rash/itching, nausea, etc)  °3. Fever over 101.5 F (38.5 C) °4. Inability to urinate °5. Nausea and/or vomiting °6. Worsening swelling or bruising °7. Continued bleeding from incision. °8. Increased pain, redness, or drainage from the incision ° °The clinic staff is available to answer your questions during regular business hours (8:30am-5pm). Please don’t hesitate to call and ask to speak to one of our nurses for clinical concerns.  °If you have a medical emergency, go to the nearest emergency room or call 911.  °A surgeon from [facility] is always on call at the hospitals  ° °[facility]  °[address], [address], [place], [place] [address] ?  °MAIN: [phone] ? TOLL FREE: [phone] ?  °FAX [phone]  °[email or web address] ° °

## 2017-09-11 NOTE — Progress Notes (Signed)
PROGRESS NOTE    Brooke Dudna Piercefield  ZOX:[id number] DOB: 20-Aug-1956 DOA: 09/08/2017 PCP: Barbie BannerWilson, Fred H, MD     Brief Narrative:  Brooke Mccarthy is a 61 year old female with history of hypothyroidism, hypertension, hyperlipidemia, GERD presented with right upper quadrant pain for 1 day.  Patient with nausea vomiting and nonbloody emesis.  Right upper quadrant consistent with cholelithiasis, dilated bile duct 12 mm concerning for common bile duct obstruction.  Admitted for further evaluation. She underwent MRCP followed by ERCP. Planned for cholecystectomy today.   Assessment & Plan:   Principal Problem:   Common bile duct dilation Active Problems:   Hypokalemia   Hyponatremia   Leukocytosis   Abnormal LFTs (liver function tests)   Biliary obstruction   Choledocholithiasis   Cholelithiasis with choledocholithiasis   Right upper quadrant pain, cholelithiasis and dilated common bile duct -MRCP with distal common bile duct stone. ERCP 12/17 with removal of stone, biliary sphincterotomy. OR today for cholecystectomy.   Transaminitis -Secondary to above, improving  Possible urinary tract infection -UA with large leukocytes, many WBC, many bacteria.  Continue ceftriaxone.  Urine culture pending.  Hypothyroidism -Continue Synthroid  Hypertension -Continue diltiazem   Anxiety -Continue xanax, wellbutrin   DVT prophylaxis: SCD Code Status: Full Family Communication: No family at bedside Disposition Plan: Discharge home when cleared from general surgery    Consultants:   GI  General Surgery   Procedures:   MRCP  ERCP   Antimicrobials:  Anti-infectives (From admission, onward)   Start     Dose/Rate Route Frequency Ordered Stop   09/08/17 2300  [MAR Hold]  cefTRIAXone (ROCEPHIN) 1 g in dextrose 5 % 50 mL IVPB     (MAR Hold since 09/11/17 1035)   1 g 100 mL/hr over 30 Minutes Intravenous Every 24 hours 09/08/17 2111         Subjective: Doing well today very  minimal right upper quadrant abdominal pain.  Ambulating around the room.  Has no other complaints, awaiting surgery this morning   Objective: Vitals:   09/11/17 1415 09/11/17 1430 09/11/17 1445 09/11/17 1514  BP: (!) 121/53 109/60 114/80 116/67  Pulse: 62 60 67 76  Resp: 15 20 17 18   Temp:   97.7 F (36.5 C) 98 F (36.7 C)  TempSrc:    Oral  SpO2: 95% 98% 93% 98%  Weight:      Height:        Intake/Output Summary (Last 24 hours) at 09/11/2017 1517 Last data filed at 09/11/2017 1515 Gross per 24 hour  Intake 1100 ml  Output 1650 ml  Net -550 ml   Filed Weights   09/08/17 2225 09/09/17 2021  Weight: 73.8 kg (162 lb 12.8 oz) 75.6 kg (166 lb 11.2 oz)    Examination:  General exam: Appears calm and comfortable  Respiratory system: Clear to auscultation. Respiratory effort normal. Cardiovascular system: S1 & S2 heard, RRR. No JVD, murmurs, rubs, gallops or clicks. No pedal edema. Gastrointestinal system: Abdomen is nondistended, soft and mildly TTP RUQ. No organomegaly or masses felt. Normal bowel sounds heard. Central nervous system: Alert and oriented. No focal neurological deficits. Extremities: Symmetric 5 x 5 power. Skin: No rashes, lesions or ulcers Psychiatry: Judgement and insight appear normal. Mood & affect appropriate.   Data Reviewed: I have personally reviewed following labs and imaging studies  CBC: Recent Labs  Lab 09/08/17 1628 09/09/17 0540 09/10/17 0556 09/11/17 0730  WBC 13.3* 5.7 6.2 11.5*  HGB 13.5 13.0 11.6* 12.5  HCT 40.6 40.1  36.5 38.4  MCV 81.0 81.5 82.4 81.4  PLT 309 314 282 322   Basic Metabolic Panel: Recent Labs  Lab 09/08/17 1628 09/09/17 0540 09/10/17 0556 09/11/17 0730  NA 134* 137 140 139  K 3.2* 4.2 3.9 3.9  CL 99* 102 109 106  CO2 24 26 25 25   GLUCOSE 152* 100* 122* 162*  BUN 15 12 7 9   CREATININE 1.25* 0.94 0.94 1.01*  CALCIUM 9.3 9.1 8.9 9.6   GFR: Estimated Creatinine Clearance: 50.6 mL/min (A) (by C-G formula  based on SCr of 1.01 mg/dL (H)). Liver Function Tests: Recent Labs  Lab 09/08/17 1628 09/09/17 0540 09/10/17 0556 09/11/17 0730  AST 181* 612* 118* 54*  ALT 142* 632* 295* 226*  ALKPHOS 230* 309* 218* 246*  BILITOT 0.9 1.0 0.5 0.3  PROT 7.4 7.0 5.8* 7.1  ALBUMIN 4.0 3.7 3.1* 3.9   Recent Labs  Lab 09/08/17 1628  LIPASE 44   No results for input(s): AMMONIA in the last 168 hours. Coagulation Profile: Recent Labs  Lab 09/09/17 0540  INR 0.93   Cardiac Enzymes: No results for input(s): CKTOTAL, CKMB, CKMBINDEX, TROPONINI in the last 168 hours. BNP (last 3 results) No results for input(s): PROBNP in the last 8760 hours. HbA1C: No results for input(s): HGBA1C in the last 72 hours. CBG: No results for input(s): GLUCAP in the last 168 hours. Lipid Profile: No results for input(s): CHOL, HDL, LDLCALC, TRIG, CHOLHDL, LDLDIRECT in the last 72 hours. Thyroid Function Tests: Recent Labs    09/09/17 0540  TSH 3.113   Anemia Panel: No results for input(s): VITAMINB12, FOLATE, FERRITIN, TIBC, IRON, RETICCTPCT in the last 72 hours. Sepsis Labs: No results for input(s): PROCALCITON, LATICACIDVEN in the last 168 hours.  Recent Results (from the past 240 hour(s))  Surgical pcr screen     Status: None   Collection Time: 09/11/17  1:16 AM  Result Value Ref Range Status   MRSA, PCR NEGATIVE NEGATIVE Final   Staphylococcus aureus NEGATIVE NEGATIVE Final    Comment: (NOTE) The Xpert SA Assay (FDA approved for NASAL specimens in patients 63 years of age and older), is one component of a comprehensive surveillance program. It is not intended to diagnose infection nor to guide or monitor treatment.        Radiology Studies: Dg Ercp Biliary & Pancreatic Ducts  Result Date: 09/10/2017 CLINICAL DATA:  61 year old female with a history of ERCP for choledocholithiasis EXAM: ERCP TECHNIQUE: Multiple spot images obtained with the fluoroscopic device and submitted for interpretation  post-procedure. FLUOROSCOPY TIME:  Fluoroscopy Time:  1 minutes 50 seconds COMPARISON:  None. FINDINGS: Limited images during ERCP. Initial image demonstrates endoscope projecting over the upper abdomen with cannulation of the ampulla and retrograde infusion within the extrahepatic biliary ducts. Subsequent images demonstrate deployment of a retrieval balloon. No large filling defect identified on these images. IMPRESSION: Limited images during ERCP demonstrates treatment of presumed choledocholithiasis with deployment of a retrieval balloon. Please refer to the dictated operative report for full details of intraoperative findings and procedure. Electronically Signed   By: Gilmer MorJaime  Wagner D.O.   On: 09/10/2017 13:08      Scheduled Meds: . [MAR Hold] ALPRAZolam  1 mg Oral BID  . [MAR Hold] buPROPion  300 mg Oral Daily  . [MAR Hold] diltiazem  240 mg Oral Daily  . HYDROmorphone      . [MAR Hold] indomethacin  100 mg Rectal Once  . [MAR Hold] levothyroxine  100 mcg Oral QAC  breakfast  . [MAR Hold] pantoprazole (PROTONIX) IV  40 mg Intravenous Q24H   Continuous Infusions: . [MAR Hold] cefTRIAXone (ROCEPHIN)  IV Stopped (09/11/17 0046)  . dextrose 5 % and 0.9 % NaCl with KCl 20 mEq/L 75 mL/hr at 09/10/17 0246  . lactated ringers 10 mL/hr at 09/11/17 1041     LOS: 3 days    Time spent: 40 minutes   Noralee Stain, DO [facility] [email or web address] Password [email or web address] 09/11/2017, 3:17 PM

## 2017-09-11 NOTE — Op Note (Signed)
OPERATIVE REPORT  DATE OF OPERATION: 09/08/2017 - 09/11/2017  PATIENT:  Brooke Mccarthy  61 y.o. female  PRE-OPERATIVE DIAGNOSIS:  symptomatic cholelithiasis and choledocholithiasis   POST-OPERATIVE DIAGNOSIS:  symptomatic cholelithiasis and choledocholithiasis  INDICATION(S) FOR OPERATION:  Gallstones with symptoms  FINDINGS:  Chronic cholecystitis, large stones  PROCEDURE:  Procedure(s): LAPAROSCOPIC CHOLECYSTECTOMY  SURGEON:  Surgeon(s): Jimmye NormanWyatt, Akiva Brassfield, MD  ASSISTANT: Hitchcock, RNFA  ANESTHESIA:   general  COMPLICATIONS:  None  EBL: ,20 ml  BLOOD ADMINISTERED: none  DRAINS: none   SPECIMEN:  Source of Specimen:  Gallbladder and contents  COUNTS CORRECT:  YES  PROCEDURE DETAILS: The patient was taken to the operating room and placed on the table in the supine position.  After an adequate endotracheal anesthetic was administered, the patient was prepped with CHloroPrep, and then draped in the usual manner exposing the entire abdomen laterally, inferiorly and up  to the costal margins.  After a proper timeout was performed including identifying the patient and the procedure to be performed, a supraumbilical 1.5cm midline incision was made using a #15 blade.  This was taken down to the fascia which was then incised with a #15 blade.  The edges of the fascia were tented up with Kocher clamps as the preperitoneal space was penetrated with a Kelly clamp into the peritoneum.  Once this was done, a pursestring suture of 0 Vicryl was passed around the fascial opening.  This was subsequently used to secure the [facility] cannula which was passed into the peritoneal cavity.  Once the [facility] cannula was in place, carbon dioxide gas was insufflated into the peritoneal cavity up to a maximal intra-abdominal pressure of 15mm Hg.The laparoscope, with attached camera and light source, was passed into the peritoneal cavity to visualize the direct insertion of two right upper quadrant 5mm cannulas,  and a sup-xiphoid 5mm cannula.  Once all cannulas were in place, the dissection was begun.  Two ratcheted graspers were attached to the dome and infundibulum of the gallbladder and retracted towards the anterior abdominal wall and the right upper quadrant.  Using cautery attached to a dissecting forceps, the peritoneum overlaying the triangle of Chalot and the hepatoduodenal triangle was dissected away exposing the cystic duct and the cystic artery.  A critical window was developed between the CBD and the cystic duct The cystic artery was clipped proximally and distally then transected.  A clip was placed on the gallbladder side of the cystic duct, then the distal cystic duct was clipped three times then transected between the clips.  The gallbladder was then dissected out of the hepatic bed without event.  It was retrieved from the abdomen using an EcoPouch bag without event.  Once the gallbladder was removed, the bed was inspected for hemostasis.  Once excellent hemostasis was obtained all gas and fluids were aspirated from above the liver, then the cannulas were removed.  The supraumbilical incision was closed using the pursestring suture which was in place.  0.25% bupivicaine with epinephrine was injected at all sites.  All 10mm or greater cannula sites were close using a running subcuticular stitch of 4-0 Monocryl.  5.600mm cannula sites were closed with Dermabond only.Steri-Strips and Tagaderm were used to complete the dressings at all sites.  At this point all needle, sponge, and instrument counts were correct.The patient was awakened from anesthesia and taken to the PACU in stable condition.  [name], [name], MD, FACS [phone]--pager [phone]--office [facility]  PATIENT DISPOSITION:  PACU - hemodynamically stable.  [name] 12/18/20181:24 PM

## 2017-09-11 NOTE — Progress Notes (Signed)
[facility]/Brooke Mccarthy Progress Note    Subjective: No distress.  ERCP yesterday successful  Objective: Vital signs in last 24 hours: Temp:  [97.5 F (36.4 C)-98.5 F (36.9 C)] 98.5 F (36.9 C) (12/18 0511) Pulse Rate:  [45-86] 51 (12/18 0511) Resp:  [14-20] 19 (12/18 0511) BP: (90-141)/(52-75) 104/52 (12/18 0511) SpO2:  [97 %-100 %] 97 % (12/18 0511) Last BM Date: 09/06/17  Intake/Output from previous day: 12/17 0701 - 12/18 0700 In: 1300 [P.O.:600; I.V.:700] Out: 1150 [Urine:1150] Intake/Output this shift: No intake/output data recorded.  General: No distress  Lungs: Clear  Abd: Soft, not tender  Extremities: Intact, no DVT  Neuro: Intact  Lab Results:  @LABLAST2 (wbc:2,hgb:2,hct:2,plt:2) BMET ) Recent Labs    09/09/17 0540 09/10/17 0556  NA 137 140  K 4.2 3.9  CL 102 109  CO2 26 25  GLUCOSE 100* 122*  BUN 12 7  CREATININE 0.94 0.94  CALCIUM 9.1 8.9   PT/INR Recent Labs    09/09/17 0540  LABPROT 12.4  INR 0.93   ABG No results for input(s): PHART, HCO3 in the last 72 hours.  Invalid input(s): PCO2, PO2  Studies/Results: Dg Ercp Biliary & Pancreatic Ducts  Result Date: 09/10/2017 CLINICAL DATA:  61 year old female with a history of ERCP for choledocholithiasis EXAM: ERCP TECHNIQUE: Multiple spot images obtained with the fluoroscopic device and submitted for interpretation post-procedure. FLUOROSCOPY TIME:  Fluoroscopy Time:  1 minutes 50 seconds COMPARISON:  None. FINDINGS: Limited images during ERCP. Initial image demonstrates endoscope projecting over the upper abdomen with cannulation of the ampulla and retrograde infusion within the extrahepatic biliary ducts. Subsequent images demonstrate deployment of a retrieval balloon. No large filling defect identified on these images. IMPRESSION: Limited images during ERCP demonstrates treatment of presumed choledocholithiasis with deployment of a retrieval balloon. Please refer to the dictated operative report for  full details of intraoperative findings and procedure. Electronically Signed   By: Brooke MorJaime  Mccarthy D.O.   On: 09/10/2017 13:08    Anti-infectives: Anti-infectives (From admission, onward)   Start     Dose/Rate Route Frequency Ordered Stop   09/08/17 2300  cefTRIAXone (ROCEPHIN) 1 g in dextrose 5 % 50 mL IVPB     1 g 100 mL/hr over 30 Minutes Intravenous Every 24 hours 09/08/17 2111        Assessment/Plan: s/p Procedure(s): LAPAROSCOPIC CHOLECYSTECTOMY For Lap Chole today.  LOS: 3 days   Brooke LamasJames O. Gae Mccarthy, [name], MD, FACS [phone]--pager [phone]--[facility] 09/11/2017

## 2017-09-11 NOTE — Anesthesia Preprocedure Evaluation (Addendum)
Anesthesia Evaluation  Patient identified by MRN, date of birth, ID band Patient awake    Reviewed: Allergy & Precautions, NPO status , Patient's Chart, lab work & pertinent test results  History of Anesthesia Complications (+) PONV  Airway Mallampati: II  TM Distance: >3 FB Neck ROM: Full    Dental  (+) Partial Upper, Partial Lower   Pulmonary Current Smoker,    breath sounds clear to auscultation       Cardiovascular hypertension, Pt. on medications  Rhythm:Regular Rate:Normal     Neuro/Psych Anxiety    GI/Hepatic GERD  ,  Endo/Other  Hypothyroidism   Renal/GU      Musculoskeletal  (+) Arthritis , Osteoarthritis,    Abdominal   Peds  Hematology   Anesthesia Other Findings   Reproductive/Obstetrics                             Anesthesia Physical  Anesthesia Plan  ASA: II  Anesthesia Plan: General   Post-op Pain Management:    Induction: Intravenous  PONV Risk Score and Plan: 3 and Treatment may vary due to age or medical condition, Ondansetron, Dexamethasone and Midazolam  Airway Management Planned: Oral ETT  Additional Equipment:   Intra-op Plan:   Post-operative Plan: Extubation in OR  Informed Consent: I have reviewed the patients History and Physical, chart, labs and discussed the procedure including the risks, benefits and alternatives for the proposed anesthesia with the patient or authorized representative who has indicated his/her understanding and acceptance.   Dental advisory given  Plan Discussed with: CRNA  Anesthesia Plan Comments:         Anesthesia Quick Evaluation

## 2017-09-11 NOTE — Progress Notes (Signed)
Pt from PACU, VSS, pain tolerated at this time without requests for pain medication, lap sites to abd x4 C/D/I with dermabond closure. Pt tolerating clear liquids at this time. Will continue to monitor.

## 2017-09-11 NOTE — Anesthesia Postprocedure Evaluation (Signed)
Anesthesia Post Note  Patient: Brooke Mccarthy  Procedure(s) Performed: LAPAROSCOPIC CHOLECYSTECTOMY (N/A Abdomen)     Patient location during evaluation: PACU Anesthesia Type: General Level of consciousness: awake and alert Pain management: pain level controlled Vital Signs Assessment: post-procedure vital signs reviewed and stable Respiratory status: spontaneous breathing, nonlabored ventilation and respiratory function stable Cardiovascular status: blood pressure returned to baseline and stable Postop Assessment: no apparent nausea or vomiting Anesthetic complications: no    Last Vitals:  Vitals:   09/11/17 1430 09/11/17 1445  BP: 109/60 114/80  Pulse: 60 67  Resp: 20 17  Temp:  36.5 C  SpO2: 98% 93%    Last Pain:  Vitals:   09/11/17 1400  TempSrc:   PainSc: 8                  Brooke Mccarthy

## 2017-09-11 NOTE — Progress Notes (Signed)
Report given to laure rn as caregiver    

## 2017-09-11 NOTE — Transfer of Care (Signed)
Immediate Anesthesia Transfer of Care Note  Patient: Rayne Dudna Budge  Procedure(s) Performed: LAPAROSCOPIC CHOLECYSTECTOMY (N/A Abdomen)  Patient Location: PACU  Anesthesia Type:General  Level of Consciousness: awake, alert  and oriented  Airway & Oxygen Therapy: Patient Spontanous Breathing and Patient connected to face mask oxygen  Post-op Assessment: Report given to RN, Post -op Vital signs reviewed and stable and Patient moving all extremities X 4  Post vital signs: Reviewed and stable  Last Vitals:  Vitals:   09/11/17 0925 09/11/17 1350  BP: 107/69   Pulse: 68   Resp: 18   Temp: 36.8 C (P) 36.6 C  SpO2: 98%     Last Pain:  Vitals:   09/11/17 0925  TempSrc: Oral  PainSc:       Patients Stated Pain Goal: 2 (09/09/17 2000)  Complications: No apparent anesthesia complications

## 2017-09-11 NOTE — Anesthesia Procedure Notes (Signed)
Procedure Name: Intubation Date/Time: 09/11/2017 12:45 PM Performed by: Kyung Rudd, CRNA Pre-anesthesia Checklist: Patient identified, Emergency Drugs available, Suction available and Patient being monitored Patient Re-evaluated:Patient Re-evaluated prior to induction Oxygen Delivery Method: Circle system utilized Preoxygenation: Pre-oxygenation with 100% oxygen Induction Type: IV induction Ventilation: Mask ventilation without difficulty Laryngoscope Size: Mac and 4 Grade View: Grade I Tube type: Oral Tube size: 7.0 mm Number of attempts: 1 Airway Equipment and Method: Stylet Placement Confirmation: ETT inserted through vocal cords under direct vision,  positive ETCO2 and breath sounds checked- equal and bilateral Secured at: 21 cm Tube secured with: Tape Dental Injury: Teeth and Oropharynx as per pre-operative assessment

## 2017-09-12 ENCOUNTER — Encounter (HOSPITAL_COMMUNITY): Payer: Self-pay | Admitting: General Surgery

## 2017-09-12 LAB — CBC
HCT: 36 % (ref 36.0–46.0)
HEMATOCRIT: 35.1 % — AB (ref 36.0–46.0)
HEMOGLOBIN: 11.2 g/dL — AB (ref 12.0–15.0)
Hemoglobin: 11.5 g/dL — ABNORMAL LOW (ref 12.0–15.0)
MCH: 26.5 pg (ref 26.0–34.0)
MCH: 26.9 pg (ref 26.0–34.0)
MCHC: 31.9 g/dL (ref 30.0–36.0)
MCHC: 31.9 g/dL (ref 30.0–36.0)
MCV: 83 fL (ref 78.0–100.0)
MCV: 84.1 fL (ref 78.0–100.0)
PLATELETS: 305 10*3/uL (ref 150–400)
Platelets: 314 10*3/uL (ref 150–400)
RBC: 4.23 MIL/uL (ref 3.87–5.11)
RBC: 4.28 MIL/uL (ref 3.87–5.11)
RDW: 14.5 % (ref 11.5–15.5)
RDW: 14.9 % (ref 11.5–15.5)
WBC: 15.1 10*3/uL — AB (ref 4.0–10.5)
WBC: 15.4 10*3/uL — AB (ref 4.0–10.5)

## 2017-09-12 LAB — URINE CULTURE: Culture: NO GROWTH

## 2017-09-12 LAB — COMPREHENSIVE METABOLIC PANEL
ALK PHOS: 178 U/L — AB (ref 38–126)
ALT: 150 U/L — AB (ref 14–54)
AST: 36 U/L (ref 15–41)
Albumin: 3.4 g/dL — ABNORMAL LOW (ref 3.5–5.0)
Anion gap: 11 (ref 5–15)
BILIRUBIN TOTAL: 0.3 mg/dL (ref 0.3–1.2)
BUN: 16 mg/dL (ref 6–20)
CO2: 26 mmol/L (ref 22–32)
CREATININE: 1.02 mg/dL — AB (ref 0.44–1.00)
Calcium: 9.3 mg/dL (ref 8.9–10.3)
Chloride: 101 mmol/L (ref 101–111)
GFR calc Af Amer: >60 mL/min (ref >60)
GFR, EST NON AFRICAN AMERICAN: 57 mL/min — AB (ref >60)
GLUCOSE: 156 mg/dL — AB (ref 65–99)
Potassium: 3.8 mmol/L (ref 3.5–5.1)
Sodium: 138 mmol/L (ref 135–145)
TOTAL PROTEIN: 6.1 g/dL — AB (ref 6.5–8.1)

## 2017-09-12 MED ORDER — ENOXAPARIN SODIUM 40 MG/0.4ML ~~LOC~~ SOLN
40.0000 mg | SUBCUTANEOUS | Status: DC
  Administered 2017-09-12: 40 mg via SUBCUTANEOUS
  Filled 2017-09-12: qty 0.4

## 2017-09-12 MED ORDER — IBUPROFEN 800 MG PO TABS: 800.0000 mg | ORAL_TABLET | Freq: Three times a day (TID) | ORAL | 0 refills | Status: DC | PRN

## 2017-09-12 NOTE — Care Management Important Message (Signed)
Important Message  Patient Details  Name: Brooke Mccarthy MRN: [id number] Date of Birth: September 09, 1956   Medicare Important Message Given:  Yes    Kyla BalzarineShealy, Derick Seminara Abena 09/12/2017, 9:17 AM

## 2017-09-12 NOTE — Discharge Summary (Addendum)
Physician Discharge Summary  Brooke Mccarthy ZOX:[id number] DOB: Dec 30, 1955 DOA: 09/08/2017  PCP: Barbie BannerWilson, Fred H, MD  Admit date: 09/08/2017 Discharge date: 09/12/2017  Admitted From: Home Disposition:  Home  Recommendations for Outpatient Follow-up:  1. Follow up with PCP in 1 week 2. [facility] weeks  3. Please obtain CBC to ensure resolution of post-op stress leukocytosis as well as repeat LFT   Discharge Condition: Stable CODE STATUS: Full  Diet recommendation: Heart healthy   Brief/Interim Summary: Brooke Mccarthy is a 61 year old female with history of hypothyroidism, hypertension, hyperlipidemia, GERD presented with right upper quadrant pain for 1 day. Patient with nausea vomiting and nonbloody emesis. Right upper quadrant consistent with cholelithiasis, dilated bile duct 12 mm concerning for common bile duct obstruction. Admitted for further evaluation. She underwent MRCP followed by ERCP. She underwent cholecystectomy on 12/18.  Discharge Diagnoses:  Principal Problem:   Common bile duct dilation Active Problems:   Hypokalemia   Hyponatremia   Leukocytosis   Abnormal LFTs (liver function tests)   Biliary obstruction   Choledocholithiasis   Cholelithiasis with choledocholithiasis  Right upper quadrant pain, cholelithiasis and dilated common bile duct -MRCP with distal common bile duct stone. ERCP 12/17 with removal of stone, biliary sphincterotomy. Lap cholecystectomy on 12/18.   Leukocytosis -Likely reactive status post surgical procedure, Afebrile  -Trend, stable prior to discharge   Transaminitis -Secondary to above, improving  Pyuria  -UA with large leukocytes, many WBC, many bacteria. Urine culture negative. Stop rocephin   Hypothyroidism -Continue Synthroid  Hypertension -Continue diltiazem   Anxiety -Continue xanax, wellbutrin    Discharge Instructions  Discharge Instructions    Call MD for:   difficulty breathing, headache or visual disturbances   Complete by:  As directed    Call MD for:  extreme fatigue   Complete by:  As directed    Call MD for:  hives   Complete by:  As directed    Call MD for:  persistant dizziness or light-headedness   Complete by:  As directed    Call MD for:  persistant nausea and vomiting   Complete by:  As directed    Call MD for:  redness, tenderness, or signs of infection (pain, swelling, redness, odor or green/yellow discharge around incision site)   Complete by:  As directed    Call MD for:  severe uncontrolled pain   Complete by:  As directed    Call MD for:  temperature >100.4   Complete by:  As directed    Diet - low sodium heart healthy   Complete by:  As directed    Increase activity slowly   Complete by:  As directed    Leave dressing on - Keep it clean, dry, and intact until clinic visit   Complete by:  As directed      Allergies as of 09/12/2017      Reactions   Hydrocodone-acetaminophen Other (See Comments)   Unknown   Ultram [tramadol]    "makes my heart beat fast"      Medication List    STOP taking these medications   amLODipine 5 MG tablet Commonly known as:  NORVASC   oxyCODONE 5 MG immediate release tablet Commonly known as:  Oxy IR/ROXICODONE     TAKE these medications   ALPRAZolam 1 MG tablet Commonly known as:  XANAX Take 1-2 tablets by mouth 2 (two) times daily.   buPROPion 300 MG 24 hr tablet Commonly known as:  WELLBUTRIN XL Take 300 mg by mouth daily.   diltiazem 240 MG 24 hr capsule Commonly known as:  DILACOR XR Take 240 mg by mouth daily.   ibuprofen 800 MG tablet Commonly known as:  ADVIL,MOTRIN Take 1 tablet (800 mg total) by mouth every 8 (eight) hours as needed.   levothyroxine 100 MCG tablet Commonly known as:  SYNTHROID, LEVOTHROID Take 100 mcg by mouth daily.   Magnesium Gluconate 550 MG Tabs Take 30 mg by mouth.   meloxicam 15 MG tablet Commonly known as:  MOBIC Take 15 mg  by mouth daily.   oxyCODONE-acetaminophen 10-325 MG tablet Commonly known as:  PERCOCET Take 1 tablet by mouth every 4 (four) hours as needed for pain.   pravastatin 80 MG tablet Commonly known as:  PRAVACHOL Take 80 mg by mouth daily.   spironolactone-hydrochlorothiazide 25-25 MG tablet Commonly known as:  ALDACTAZIDE Take 1 tablet by mouth daily.      Follow-up Information    Barbie Banner, MD. Schedule an appointment as soon as possible for a visit in 1 day(s).   Specialty:  Family Medicine Contact information: [address] [place] [address] [phone]        [facility], [place]. Go on 09/27/2017.   Specialty:  General Surgery Why:  Your appointment is at 2:30 PM. Please arrive 30 min prior to appointment time. Bring photo ID and insurance information.  Contact information: [address] [place] [address] [phone]          Allergies  Allergen Reactions  . Hydrocodone-Acetaminophen Other (See Comments)    Unknown  . Ultram [Tramadol]     "makes my heart beat fast"    Consultations:  GI  General Surgery    Procedures/Studies: Dg Chest 2 View  Result Date: 09/09/2017 CLINICAL DATA:  Nausea and vomiting today. EXAM: CHEST  2 VIEW COMPARISON:  10/10/2013. FINDINGS: The heart size and mediastinal contours are within normal limits. Aortic atherosclerosis at the arch. No aneurysm. Both lungs are clear. The visualized skeletal structures are unremarkable. IMPRESSION: No active cardiopulmonary disease.  Aortic atherosclerosis. Electronically Signed   By: Tollie Eth M.D.   On: 09/09/2017 01:14   Mr 3d Recon At Scanner  Result Date: 09/09/2017 CLINICAL DATA:  Elevated LFTs.Gallstones. Increase caliber of the common bile duct EXAM: MRI ABDOMEN WITHOUT AND WITH CONTRAST (INCLUDING MRCP) TECHNIQUE: Multiplanar multisequence MR imaging of the abdomen was performed both before and after the administration of intravenous contrast.  Heavily T2-weighted images of the biliary and pancreatic ducts were obtained, and three-dimensional MRCP images were rendered by post processing. CONTRAST:  14mL MULTIHANCE GADOBENATE DIMEGLUMINE 529 MG/ML IV SOLN COMPARISON:  09/08/2017 FINDINGS: Lower chest: No acute findings. Hepatobiliary: No focal liver abnormality. Mild intrahepatic biliary dilatation. There is diffuse hepatic steatosis. Stones identified within the lumen of the gallbladder. These measure up to 2 cm. Increase caliber of the CBD which measures 7 mm in maximum diameter. There is a questionable tiny stone within the distal CBD measuring 4 mm, image 17 of series 4 and image 24 of series 7. Pancreas: No pancreatic ductal dilatation or inflammation. No mass. Spleen:  Within normal limits in size and appearance. Adrenals/Urinary Tract: No masses identified. No evidence of hydronephrosis. Small right kidney cysts measures 8 mm. Stomach/Bowel: Visualized portions within the abdomen are unremarkable. Vascular/Lymphatic: No pathologically enlarged lymph nodes identified. No abdominal aortic aneurysm demonstrated. Other:  None. Musculoskeletal: No suspicious bone lesions identified. IMPRESSION: 1. Gallstones.  The common bile duct is increased in caliber and there is a an equivocal filling defect within the distal CBD which may represent a small 4 mm common bile duct stone. 2. Hepatic steatosis. Electronically Signed   By: Signa Kell M.D.   On: 09/09/2017 10:20   Dg Ercp Biliary & Pancreatic Ducts  Result Date: 09/10/2017 CLINICAL DATA:  61 year old female with a history of ERCP for choledocholithiasis EXAM: ERCP TECHNIQUE: Multiple spot images obtained with the fluoroscopic device and submitted for interpretation post-procedure. FLUOROSCOPY TIME:  Fluoroscopy Time:  1 minutes 50 seconds COMPARISON:  None. FINDINGS: Limited images during ERCP. Initial image demonstrates endoscope projecting over the upper abdomen with cannulation of the ampulla  and retrograde infusion within the extrahepatic biliary ducts. Subsequent images demonstrate deployment of a retrieval balloon. No large filling defect identified on these images. IMPRESSION: Limited images during ERCP demonstrates treatment of presumed choledocholithiasis with deployment of a retrieval balloon. Please refer to the dictated operative report for full details of intraoperative findings and procedure. Electronically Signed   By: Gilmer Mor D.O.   On: 09/10/2017 13:08   Mr Abdomen Mrcp Vivien Rossetti Contast  Result Date: 09/09/2017 CLINICAL DATA:  Elevated LFTs.Gallstones. Increase caliber of the common bile duct EXAM: MRI ABDOMEN WITHOUT AND WITH CONTRAST (INCLUDING MRCP) TECHNIQUE: Multiplanar multisequence MR imaging of the abdomen was performed both before and after the administration of intravenous contrast. Heavily T2-weighted images of the biliary and pancreatic ducts were obtained, and three-dimensional MRCP images were rendered by post processing. CONTRAST:  14mL MULTIHANCE GADOBENATE DIMEGLUMINE 529 MG/ML IV SOLN COMPARISON:  09/08/2017 FINDINGS: Lower chest: No acute findings. Hepatobiliary: No focal liver abnormality. Mild intrahepatic biliary dilatation. There is diffuse hepatic steatosis. Stones identified within the lumen of the gallbladder. These measure up to 2 cm. Increase caliber of the CBD which measures 7 mm in maximum diameter. There is a questionable tiny stone within the distal CBD measuring 4 mm, image 17 of series 4 and image 24 of series 7. Pancreas: No pancreatic ductal dilatation or inflammation. No mass. Spleen:  Within normal limits in size and appearance. Adrenals/Urinary Tract: No masses identified. No evidence of hydronephrosis. Small right kidney cysts measures 8 mm. Stomach/Bowel: Visualized portions within the abdomen are unremarkable. Vascular/Lymphatic: No pathologically enlarged lymph nodes identified. No abdominal aortic aneurysm demonstrated. Other:  None.  Musculoskeletal: No suspicious bone lesions identified. IMPRESSION: 1. Gallstones. The common bile duct is increased in caliber and there is a an equivocal filling defect within the distal CBD which may represent a small 4 mm common bile duct stone. 2. Hepatic steatosis. Electronically Signed   By: Signa Kell M.D.   On: 09/09/2017 10:20   US Abdomen Limited Ruq  Result Date: 09/08/2017 CLINICAL DATA:  Abdominal pain. EXAM: ULTRASOUND ABDOMEN LIMITED RIGHT UPPER QUADRANT COMPARISON:  None. FINDINGS: Gallbladder: Stones and sludge filled gallbladder with no wall thickening, pericholecystic fluid, or Murphy's sign. Common bile duct: Diameter: 12 mm today versus 14 mm previously. The distal duct is not seen due to shadowing bowel gas. Liver: Probable hepatic steatosis. No focal mass. Portal vein is patent on color Doppler imaging with normal direction of blood flow towards the liver. IMPRESSION: 1. Stones and sludge filled gallbladder without wall thickening, pericholecystic fluid, or Murphy's sign. 2. The common bile duct is dilated measuring 12 mm today versus 14 mm previously. This finding is concerning for common bile duct obstruction. An MRCP could better evaluate. Electronically Signed   By: Gerome Sam [name]  M.D   On: 09/08/2017 19:17   [place] Abdomen Limited Ruq  Result Date: 09/05/2017 CLINICAL DATA:  Right upper quadrant abdominal pain for several weeks. EXAM: ULTRASOUND ABDOMEN LIMITED RIGHT UPPER QUADRANT COMPARISON:  None. FINDINGS: Gallbladder: Multiple gallstones are noted with the largest measuring 2.9 cm in diameter. Sludge is noted as well. No gallbladder wall thickening or pericholecystic fluid is noted. No sonographic Murphy's sign is noted. Common bile duct: Diameter: 14 mm consistent with severe dilatation. This is concerning for distal common bile duct obstruction and possible choledocholithiasis. Liver: No focal lesion identified. Mildly increased echogenicity is noted in the hepatic  parenchyma suggesting fatty infiltration. Portal vein is patent on color Doppler imaging with normal direction of blood flow towards the liver. IMPRESSION: Cholelithiasis is noted without evidence of cholecystitis. However, severe common bile duct dilatation is noted concerning for distal common bile duct obstruction and possible choledocholithiasis. Further evaluation with CT or MRCP is recommended. These results will be called to the ordering clinician or representative by the Radiologist Assistant, and communication documented in the PACS or zVision Dashboard. Probable fatty infiltration of the liver. Electronically Signed   By: Lupita RaiderJames  Green Jr, M.D.   On: 09/05/2017 10:27      Discharge Exam: Vitals:   09/11/17 2041 09/12/17 0930  BP: (!) 113/59 112/61  Pulse:    Resp: 18 18  Temp: 98.4 F (36.9 C) 98.6 F (37 C)  SpO2: 98% 98%   Vitals:   09/11/17 1514 09/11/17 1700 09/11/17 2041 09/12/17 0930  BP: 116/67 121/72 (!) 113/59 112/61  Pulse: 76 68    Resp: 18 18 18 18   Temp: 98 F (36.7 C) 98 F (36.7 C) 98.4 F (36.9 C) 98.6 F (37 C)  TempSrc: Oral Oral Oral Oral  SpO2: 98% 98% 98% 98%  Weight:   80.5 kg (177 lb 7.5 oz)   Height:        General: Pt is alert, awake, not in acute distress Cardiovascular: RRR, S1/S2 +, no rubs, no gallops Respiratory: CTA bilaterally, no wheezing, no rhonchi Abdominal: Soft, NT, ND, bowel sounds + Extremities: no edema, no cyanosis    The results of significant diagnostics from this hospitalization (including imaging, microbiology, ancillary and laboratory) are listed below for reference.     Microbiology: Recent Results (from the past 240 hour(s))  Surgical pcr screen     Status: None   Collection Time: 09/11/17  1:16 AM  Result Value Ref Range Status   MRSA, PCR NEGATIVE NEGATIVE Final   Staphylococcus aureus NEGATIVE NEGATIVE Final    Comment: (NOTE) The Xpert SA Assay (FDA approved for NASAL specimens in patients 10222 years of age  and older), is one component of a comprehensive surveillance program. It is not intended to diagnose infection nor to guide or monitor treatment.   Culture, Urine     Status: None   Collection Time: 09/11/17  1:45 AM  Result Value Ref Range Status   Specimen Description URINE, RANDOM  Final   Special Requests NONE  Final   Culture NO GROWTH  Final   Report Status 09/12/2017 FINAL  Final     Labs: BNP (last 3 results) No results for input(s): BNP in the last 8760 hours. Basic Metabolic Panel: Recent Labs  Lab 09/08/17 1628 09/09/17 0540 09/10/17 0556 09/11/17 0730 09/12/17 0513  NA 134* 137 140 139 138  K 3.2* 4.2 3.9 3.9 3.8  CL 99* 102 109 106 101  CO2 24 26 25 25  26  GLUCOSE 152* 100* 122* 162* 156*  BUN 15 12 7 9 16   CREATININE 1.25* 0.94 0.94 1.01* 1.02*  CALCIUM 9.3 9.1 8.9 9.6 9.3   Liver Function Tests: Recent Labs  Lab 09/08/17 1628 09/09/17 0540 09/10/17 0556 09/11/17 0730 09/12/17 0513  AST 181* 612* 118* 54* 36  ALT 142* 632* 295* 226* 150*  ALKPHOS 230* 309* 218* 246* 178*  BILITOT 0.9 1.0 0.5 0.3 0.3  PROT 7.4 7.0 5.8* 7.1 6.1*  ALBUMIN 4.0 3.7 3.1* 3.9 3.4*   Recent Labs  Lab 09/08/17 1628  LIPASE 44   No results for input(s): AMMONIA in the last 168 hours. CBC: Recent Labs  Lab 09/09/17 0540 09/10/17 0556 09/11/17 0730 09/12/17 0513 09/12/17 1453  WBC 5.7 6.2 11.5* 15.1* 15.4*  HGB 13.0 11.6* 12.5 11.2* 11.5*  HCT 40.1 36.5 38.4 35.1* 36.0  MCV 81.5 82.4 81.4 83.0 84.1  PLT 314 282 322 314 305   Cardiac Enzymes: No results for input(s): CKTOTAL, CKMB, CKMBINDEX, TROPONINI in the last 168 hours. BNP: Invalid input(s): POCBNP CBG: No results for input(s): GLUCAP in the last 168 hours. D-Dimer No results for input(s): DDIMER in the last 72 hours. Hgb A1c No results for input(s): HGBA1C in the last 72 hours. Lipid Profile No results for input(s): CHOL, HDL, LDLCALC, TRIG, CHOLHDL, LDLDIRECT in the last 72 hours. Thyroid  function studies No results for input(s): TSH, T4TOTAL, T3FREE, THYROIDAB in the last 72 hours.  Invalid input(s): FREET3 Anemia work up No results for input(s): VITAMINB12, FOLATE, FERRITIN, TIBC, IRON, RETICCTPCT in the last 72 hours. Urinalysis    Component Value Date/Time   COLORURINE AMBER (A) 09/08/2017 1626   APPEARANCEUR TURBID (A) 09/08/2017 1626   LABSPEC 1.018 09/08/2017 1626   PHURINE 8.0 09/08/2017 1626   GLUCOSEU NEGATIVE 09/08/2017 1626   HGBUR SMALL (A) 09/08/2017 1626   BILIRUBINUR NEGATIVE 09/08/2017 1626   KETONESUR NEGATIVE 09/08/2017 1626   PROTEINUR 30 (A) 09/08/2017 1626   UROBILINOGEN 0.2 01/16/2011 1209   NITRITE NEGATIVE 09/08/2017 1626   LEUKOCYTESUR LARGE (A) 09/08/2017 1626   Sepsis Labs Invalid input(s): PROCALCITONIN,  WBC,  LACTICIDVEN Microbiology Recent Results (from the past 240 hour(s))  Surgical pcr screen     Status: None   Collection Time: 09/11/17  1:16 AM  Result Value Ref Range Status   MRSA, PCR NEGATIVE NEGATIVE Final   Staphylococcus aureus NEGATIVE NEGATIVE Final    Comment: (NOTE) The Xpert SA Assay (FDA approved for NASAL specimens in patients 23 years of age and older), is one component of a comprehensive surveillance program. It is not intended to diagnose infection nor to guide or monitor treatment.   Culture, Urine     Status: None   Collection Time: 09/11/17  1:45 AM  Result Value Ref Range Status   Specimen Description URINE, RANDOM  Final   Special Requests NONE  Final   Culture NO GROWTH  Final   Report Status 09/12/2017 FINAL  Final     Time coordinating discharge: 40 minutes  SIGNED:  Noralee Stain, DO [facility] Pager [phone]  If 7PM-7AM, please contact night-coverage [email or web address] Password [facility] 09/12/2017, 3:39 PM

## 2017-09-12 NOTE — Progress Notes (Addendum)
PROGRESS NOTE    Brooke Dudna Schleyer  AVW:[id number] DOB: 1956/06/02 DOA: 09/08/2017 PCP: Barbie BannerWilson, Fred H, MD     Brief Narrative:  Brooke Mccarthy is a 61 year old female with history of hypothyroidism, hypertension, hyperlipidemia, GERD presented with right upper quadrant pain for 1 day.  Patient with nausea vomiting and nonbloody emesis.  Right upper quadrant consistent with cholelithiasis, dilated bile duct 12 mm concerning for common bile duct obstruction.  Admitted for further evaluation. She underwent MRCP followed by ERCP. She underwent cholecystectomy on 12/18.  Assessment & Plan:   Principal Problem:   Common bile duct dilation Active Problems:   Hypokalemia   Hyponatremia   Leukocytosis   Abnormal LFTs (liver function tests)   Biliary obstruction   Choledocholithiasis   Cholelithiasis with choledocholithiasis   Right upper quadrant pain, cholelithiasis and dilated common bile duct -MRCP with distal common bile duct stone. ERCP 12/17 with removal of stone, biliary sphincterotomy. Lap cholecystectomy on 12/18.   Leukocytosis -Likely reactive status post surgical procedure, Afebrile  -Trend  Transaminitis -Secondary to above, improving  Pyuria  -UA with large leukocytes, many WBC, many bacteria. Urine culture negative. Stop rocephin today   Hypothyroidism -Continue Synthroid  Hypertension -Continue diltiazem   Anxiety -Continue xanax, wellbutrin   DVT prophylaxis: Lovenox  Code Status: Full Family Communication: No family at bedside Disposition Plan: Discharge home tomorrow if leukocytosis improved. Patient uncomfortable discharging home today due to elevated WBC. Cleared from general surgery for discharge.    Consultants:   GI  General Surgery   Procedures:   MRCP  ERCP   Lap chole  Antimicrobials:  Anti-infectives (From admission, onward)   Start     Dose/Rate Route Frequency Ordered Stop   09/08/17 2300  cefTRIAXone (ROCEPHIN) 1 g in  dextrose 5 % 50 mL IVPB  Status:  Discontinued     1 g 100 mL/hr over 30 Minutes Intravenous Every 24 hours 09/08/17 2111 09/12/17 1252       Subjective: Doing well today.  No new complaints.  Tolerated meal, no nausea or vomiting, passing gas   Objective: Vitals:   09/11/17 1514 09/11/17 1700 09/11/17 2041 09/12/17 0930  BP: 116/67 121/72 (!) 113/59 112/61  Pulse: 76 68    Resp: 18 18 18 18   Temp: 98 F (36.7 C) 98 F (36.7 C) 98.4 F (36.9 C) 98.6 F (37 C)  TempSrc: Oral Oral Oral Oral  SpO2: 98% 98% 98% 98%  Weight:   80.5 kg (177 lb 7.5 oz)   Height:        Intake/Output Summary (Last 24 hours) at 09/12/2017 1255 Last data filed at 09/12/2017 0810 Gross per 24 hour  Intake 1302 ml  Output 750 ml  Net 552 ml   Filed Weights   09/08/17 2225 09/09/17 2021 09/11/17 2041  Weight: 73.8 kg (162 lb 12.8 oz) 75.6 kg (166 lb 11.2 oz) 80.5 kg (177 lb 7.5 oz)    Examination:  General exam: Appears calm and comfortable  Respiratory system: Clear to auscultation. Respiratory effort normal. Cardiovascular system: S1 & S2 heard, RRR. No JVD, murmurs, rubs, gallops or clicks. No pedal edema. Gastrointestinal system: Abdomen is nondistended, soft. Incision sites clean and dry. No organomegaly or masses felt. Normal bowel sounds heard. Central nervous system: Alert and oriented. No focal neurological deficits. Extremities: Symmetric 5 x 5 power. Skin: No rashes, lesions or ulcers Psychiatry: Judgement and insight appear normal. Mood & affect appropriate.   Data Reviewed: I have personally reviewed following labs  and imaging studies  CBC: Recent Labs  Lab 09/08/17 1628 09/09/17 0540 09/10/17 0556 09/11/17 0730 09/12/17 0513  WBC 13.3* 5.7 6.2 11.5* 15.1*  HGB 13.5 13.0 11.6* 12.5 11.2*  HCT 40.6 40.1 36.5 38.4 35.1*  MCV 81.0 81.5 82.4 81.4 83.0  PLT 309 314 282 322 314   Basic Metabolic Panel: Recent Labs  Lab 09/08/17 1628 09/09/17 0540 09/10/17 0556  09/11/17 0730 09/12/17 0513  NA 134* 137 140 139 138  K 3.2* 4.2 3.9 3.9 3.8  CL 99* 102 109 106 101  CO2 24 26 25 25 26   GLUCOSE 152* 100* 122* 162* 156*  BUN 15 12 7 9 16   CREATININE 1.25* 0.94 0.94 1.01* 1.02*  CALCIUM 9.3 9.1 8.9 9.6 9.3   GFR: Estimated Creatinine Clearance: 51.8 mL/min (A) (by C-G formula based on SCr of 1.02 mg/dL (H)). Liver Function Tests: Recent Labs  Lab 09/08/17 1628 09/09/17 0540 09/10/17 0556 09/11/17 0730 09/12/17 0513  AST 181* 612* 118* 54* 36  ALT 142* 632* 295* 226* 150*  ALKPHOS 230* 309* 218* 246* 178*  BILITOT 0.9 1.0 0.5 0.3 0.3  PROT 7.4 7.0 5.8* 7.1 6.1*  ALBUMIN 4.0 3.7 3.1* 3.9 3.4*   Recent Labs  Lab 09/08/17 1628  LIPASE 44   No results for input(s): AMMONIA in the last 168 hours. Coagulation Profile: Recent Labs  Lab 09/09/17 0540  INR 0.93   Cardiac Enzymes: No results for input(s): CKTOTAL, CKMB, CKMBINDEX, TROPONINI in the last 168 hours. BNP (last 3 results) No results for input(s): PROBNP in the last 8760 hours. HbA1C: No results for input(s): HGBA1C in the last 72 hours. CBG: No results for input(s): GLUCAP in the last 168 hours. Lipid Profile: No results for input(s): CHOL, HDL, LDLCALC, TRIG, CHOLHDL, LDLDIRECT in the last 72 hours. Thyroid Function Tests: No results for input(s): TSH, T4TOTAL, FREET4, T3FREE, THYROIDAB in the last 72 hours. Anemia Panel: No results for input(s): VITAMINB12, FOLATE, FERRITIN, TIBC, IRON, RETICCTPCT in the last 72 hours. Sepsis Labs: No results for input(s): PROCALCITON, LATICACIDVEN in the last 168 hours.  Recent Results (from the past 240 hour(s))  Surgical pcr screen     Status: None   Collection Time: 09/11/17  1:16 AM  Result Value Ref Range Status   MRSA, PCR NEGATIVE NEGATIVE Final   Staphylococcus aureus NEGATIVE NEGATIVE Final    Comment: (NOTE) The Xpert SA Assay (FDA approved for NASAL specimens in patients 63 years of age and older), is one component of  a comprehensive surveillance program. It is not intended to diagnose infection nor to guide or monitor treatment.   Culture, Urine     Status: None   Collection Time: 09/11/17  1:45 AM  Result Value Ref Range Status   Specimen Description URINE, RANDOM  Final   Special Requests NONE  Final   Culture NO GROWTH  Final   Report Status 09/12/2017 FINAL  Final       Radiology Studies: Dg Ercp Biliary & Pancreatic Ducts  Result Date: 09/10/2017 CLINICAL DATA:  61 year old female with a history of ERCP for choledocholithiasis EXAM: ERCP TECHNIQUE: Multiple spot images obtained with the fluoroscopic device and submitted for interpretation post-procedure. FLUOROSCOPY TIME:  Fluoroscopy Time:  1 minutes 50 seconds COMPARISON:  None. FINDINGS: Limited images during ERCP. Initial image demonstrates endoscope projecting over the upper abdomen with cannulation of the ampulla and retrograde infusion within the extrahepatic biliary ducts. Subsequent images demonstrate deployment of a retrieval balloon. No large filling  defect identified on these images. IMPRESSION: Limited images during ERCP demonstrates treatment of presumed choledocholithiasis with deployment of a retrieval balloon. Please refer to the dictated operative report for full details of intraoperative findings and procedure. Electronically Signed   By: Gilmer Mor D.O.   On: 09/10/2017 13:08      Scheduled Meds: . ALPRAZolam  1 mg Oral BID  . buPROPion  300 mg Oral Daily  . diltiazem  240 mg Oral Daily  . docusate sodium  100 mg Oral BID  . indomethacin  100 mg Rectal Once  . levothyroxine  100 mcg Oral QAC breakfast  . pantoprazole (PROTONIX) IV  40 mg Intravenous Q24H   Continuous Infusions:    LOS: 4 days    Time spent: 20 minutes   Noralee Stain, DO [facility] [email or web address] Password [email or web address] 09/12/2017, 12:55 PM

## 2017-09-12 NOTE — Care Management Note (Signed)
Case Management Note  Patient Details  Name: Brooke Mccarthy MRN: [id number] Date of Birth: 10-10-1955  Subjective/Objective:                 From home, independent pta, choley, no home needs identified. Anticipate DC to home today if WBC improved.    Action/Plan:   Expected Discharge Date:  09/11/17               Expected Discharge Plan:  Home/Self Care  In-House Referral:     Discharge planning Services  CM Consult  Post Acute Care Choice:    Choice offered to:     DME Arranged:    DME Agency:     HH Arranged:    HH Agency:     Status of Service:  Completed, signed off  If discussed at [employer] Length [employer] Stay Meetings, dates discussed:    Additional Comments:  Lawerance SabalDebbie Kaja Jackowski, RN 09/12/2017, 3:34 PM

## 2017-09-12 NOTE — Progress Notes (Signed)
[facility]/Trauma Progress Note  1 Day Post-Op   Assessment/Plan  Principal Problem:   Common bile duct dilation Active Problems:   Hypokalemia   Hyponatremia   Leukocytosis   Abnormal LFTs (liver function tests)   Biliary obstruction   Choledocholithiasis   Cholelithiasis with choledocholithiasis  Chronic cholecystitis - S/P laparoscopic cholecystectomy, Dr. Wyatt, 12/18 - LFT's trending down along with alk phos - Tbili remains WNL - WBC slightly up likely reactive from surgery, will recheck in the AM if pt is still here  FEN: soft diet VTE: SCD's, lovenox ID: [id number] 12/15>> Follow up: 2 weeks [facility] clinic  DISPO: Pt is clear for discharge from a surgical standpoint. Will start lovenox. AM labs to check CBC    LOS: 4 days    Subjective:  CC: abdominal soreness  Pt is having flatus, no nausea or vomiting. Tolerating diet. No BM. Some belching. No new complaints.   Objective: Vital signs in last 24 hours: Temp:  [97.7 F (36.5 C)-98.4 F (36.9 C)] 98.4 F (36.9 C) (12/18 2041) Pulse Rate:  [60-86] 68 (12/18 1700) Resp:  [15-20] 18 (12/18 2041) BP: (109-121)/(53-93) 113/59 (12/18 2041) SpO2:  [93 %-100 %] 98 % (12/18 2041) Weight:  [177 lb 7.5 oz (80.5 kg)] 177 lb 7.5 oz (80.5 kg) (12/18 2041) Last BM Date: 09/06/17  Intake/Output from previous day: 12/18 0701 - 12/19 0700 In: 1062 [P.O.:462; I.V.:500; IV Piggyback:100] Out: 700 [Urine:650; Blood:50] Intake/Output this shift: Total I/O In: 240 [P.O.:240] Out: 700 [Urine:700]  PE: Gen:  Alert, NAD, pleasant, cooperative Card:  RRR, no M/G/R heard Pulm:  Rate and effort normal Abd: Soft, not distended, appropriately tender, +BS, incisions C/D/I Skin: no rashes noted, warm and dry   Anti-infectives: Anti-infectives (From admission, onward)   Start     Dose/Rate Route Frequency Ordered Stop   09/08/17 2300  cefTRIAXone (ROCEPHIN) 1 g in dextrose 5 % 50 mL IVPB     1 g 100 mL/hr over  30 Minutes Intravenous Every 24 hours 09/08/17 2111        Lab Results:  Recent Labs    09/11/17 0730 09/12/17 0513  WBC 11.5* 15.1*  HGB 12.5 11.2*  HCT 38.4 35.1*  PLT 322 314   BMET Recent Labs    09/11/17 0730 09/12/17 0513  NA 139 138  K 3.9 3.8  CL 106 101  CO2 25 26  GLUCOSE 162* 156*  BUN 9 16  CREATININE 1.01* 1.02*  CALCIUM 9.6 9.3   PT/INR No results for input(s): LABPROT, INR in the last 72 hours. CMP     Component Value Date/Time   NA 138 09/12/2017 0513   K 3.8 09/12/2017 0513   CL 101 09/12/2017 0513   CO2 26 09/12/2017 0513   GLUCOSE 156 (H) 09/12/2017 0513   BUN 16 09/12/2017 0513   CREATININE 1.02 (H) 09/12/2017 0513   CALCIUM 9.3 09/12/2017 0513   PROT 6.1 (L) 09/12/2017 0513   ALBUMIN 3.4 (L) 09/12/2017 0513   AST 36 09/12/2017 0513   ALT 150 (H) 09/12/2017 0513   ALKPHOS 178 (H) 09/12/2017 0513   BILITOT 0.3 09/12/2017 0513   GFRNONAA 57 (L) 09/12/2017 0513   GFRAA >60 09/12/2017 0513   Lipase     Component Value Date/Time   LIPASE 44 09/08/2017 1628    Studies/Results: Dg Ercp Biliary & Pancreatic Ducts  Result Date: 09/10/2017 CLINICAL DATA:  61-year-old female with a history of ERCP for choledocholithiasis EXAM: ERCP TECHNIQUE: Multiple spot   images obtained with the fluoroscopic device and submitted for interpretation post-procedure. FLUOROSCOPY TIME:  Fluoroscopy Time:  1 minutes 50 seconds COMPARISON:  None. FINDINGS: Limited images during ERCP. Initial image demonstrates endoscope projecting over the upper abdomen with cannulation of the ampulla and retrograde infusion within the extrahepatic biliary ducts. Subsequent images demonstrate deployment of a retrieval balloon. No large filling defect identified on these images. IMPRESSION: Limited images during ERCP demonstrates treatment of presumed choledocholithiasis with deployment of a retrieval balloon. Please refer to the dictated operative report for full details of  intraoperative findings and procedure. Electronically Signed   By: Corrie Mckusick D.O.   On: 09/10/2017 13:08      Kalman Drape , [facility] 09/12/2017, 9:46 AM Pager: [phone] Consults: [phone] Mon-Fri 7:00 am-4:30 pm Sat-Sun 7:00 am-11:30 am

## 2019-08-29 ENCOUNTER — Ambulatory Visit
Admission: RE | Admit: 2019-08-29 | Discharge: 2019-08-29 | Disposition: A | Discharge status: Home / Self Care | Payer: Medicare Other | Source: Ambulatory Visit | Attending: Family Medicine | Admitting: Family Medicine

## 2019-08-29 ENCOUNTER — Other Ambulatory Visit: Payer: Self-pay

## 2019-08-29 ENCOUNTER — Other Ambulatory Visit: Payer: Self-pay | Admitting: Family Medicine

## 2019-08-29 DIAGNOSIS — M79641 Pain in right hand: Secondary | ICD-10-CM

## 2019-08-29 DIAGNOSIS — M79642 Pain in left hand: Secondary | ICD-10-CM

## 2019-11-16 ENCOUNTER — Ambulatory Visit: Payer: Medicare HMO | Attending: Internal Medicine

## 2019-11-16 DIAGNOSIS — Z23 Encounter for immunization: Secondary | ICD-10-CM | POA: Insufficient documentation

## 2019-11-16 NOTE — Progress Notes (Signed)
   Covid-19 Vaccination Clinic  Name:  Brooke Mccarthy    MRN: [id number] DOB: Feb 26, 1956  11/16/2019  Ms. Brooke Mccarthy was observed post Covid-19 immunization for 15 minutes without incidence. She was provided with Vaccine Information Sheet and instruction to access the V-Safe system.   Ms. Brooke Mccarthy was instructed to call 911 with any severe reactions post vaccine: Marland Kitchen Difficulty breathing  . Swelling of your face and throat  . A fast heartbeat  . A bad rash all over your body  . Dizziness and weakness    Immunizations Administered    Name Date Dose VIS Date Route   Pfizer COVID-19 Vaccine 11/16/2019  8:35 AM 0.3 mL 09/05/2019 Intramuscular   Manufacturer: [employer], [employer]   Lot: [id number]   NDC: [address]-9

## 2019-12-09 ENCOUNTER — Ambulatory Visit: Payer: Medicare HMO | Attending: Internal Medicine

## 2019-12-09 DIAGNOSIS — Z23 Encounter for immunization: Secondary | ICD-10-CM

## 2019-12-09 NOTE — Progress Notes (Signed)
   Covid-19 Vaccination Clinic  Name:  Cloie Wooden    MRN: [id number] DOB: [birth year]  12/09/2019  Ms. Layfield was observed post Covid-19 immunization for 15 minutes without incident. She was provided with Vaccine Information Sheet and instruction to access the V-Safe system.   Ms. Brissette was instructed to call 911 with any severe reactions post vaccine: Marland Kitchen Difficulty breathing  . Swelling of face and throat  . A fast heartbeat  . A bad rash all over body  . Dizziness and weakness   Immunizations Administered    Name Date Dose VIS Date Route   Pfizer COVID-19 Vaccine 12/09/2019  3:05 PM 0.3 mL 09/05/2019 Intramuscular   Manufacturer: [employer], [employer]   Lot: [id number]   NDC: [address]-0

## 2020-08-17 IMAGING — CR DG HAND COMPLETE 3+V*L*
3 series · 3 of 3 positions shown · non-contrast
Comparison: Right hand radiograph dated 08/29/2019.

CLINICAL DATA: [age] year female with hand pain and bilateral
rheumatoid arthritis.

EXAM:
LEFT HAND - COMPLETE 3+ VIEW

[x hand pa left]
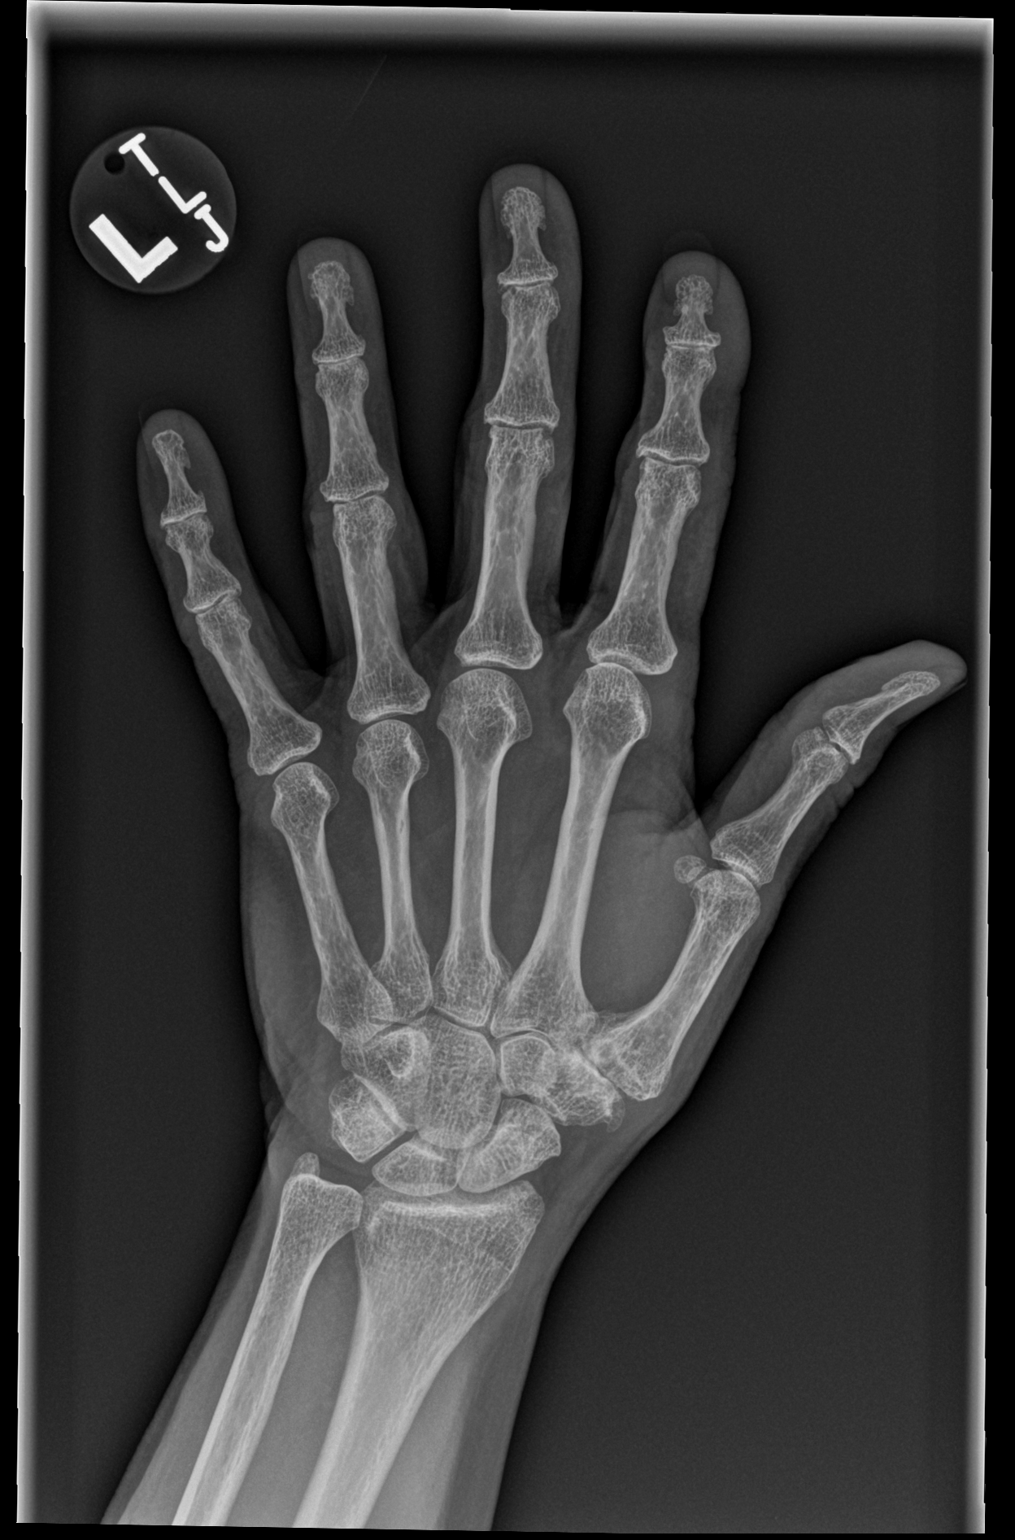

[x hand obl left]
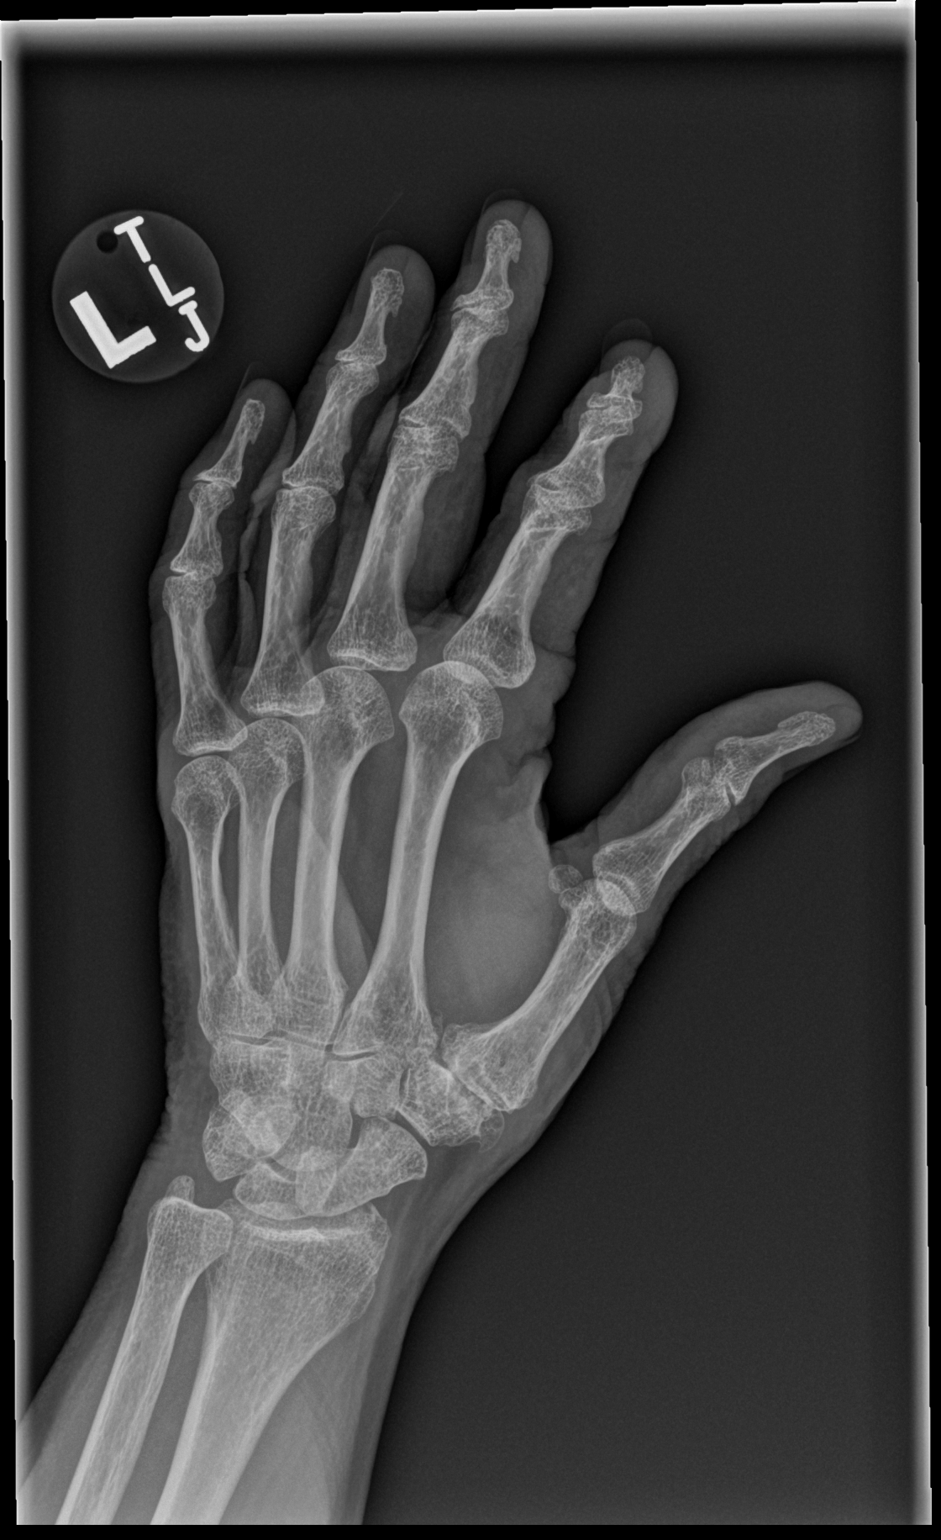

[x hand lat left]
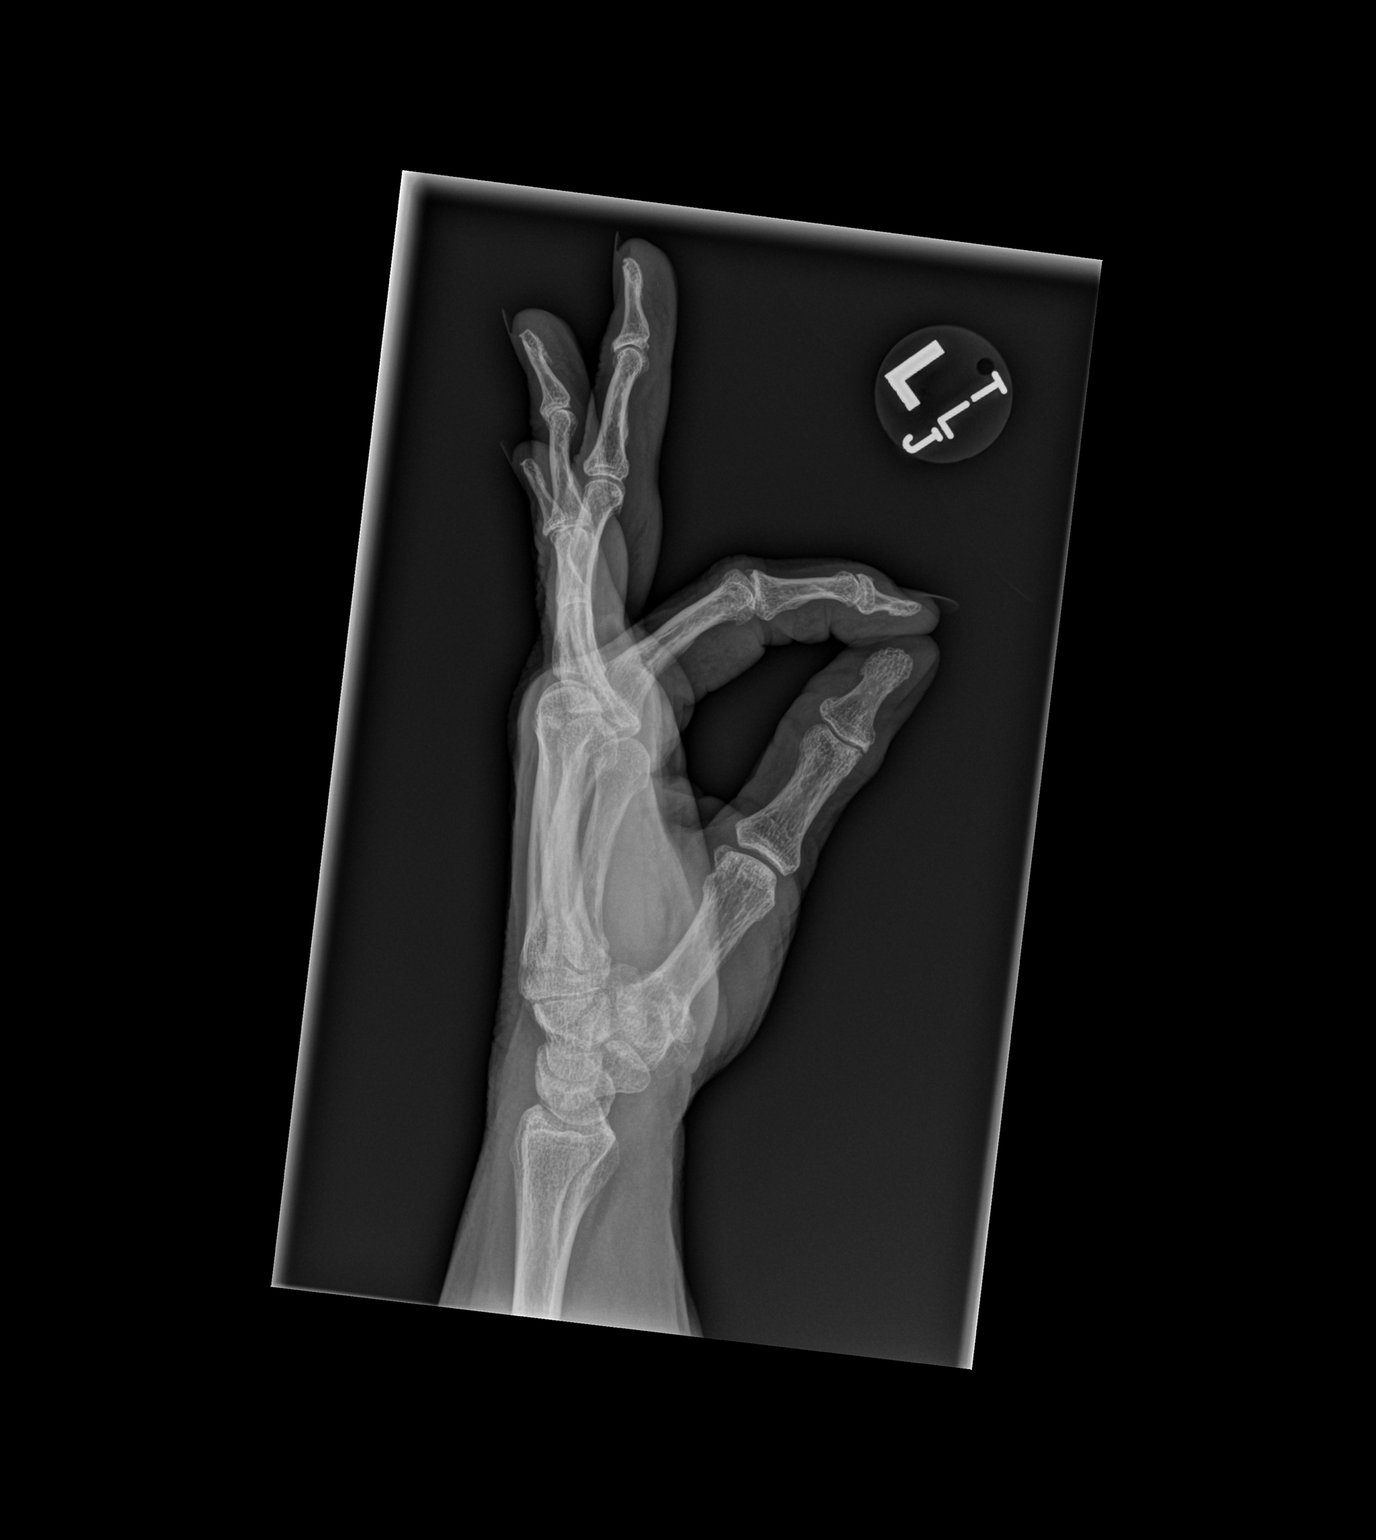

[3 of 3 positions shown; findings below may reference images not displayed]

FINDINGS: There is no acute fracture or dislocation. The bones are osteopenic.
There is osteoarthritic changes of the base of the thumb and mild
arthritic changes of the DIP joints. The soft tissues are
unremarkable.
IMPRESSION: 1. No acute fracture or dislocation.
2. Osteoarthritis of the base of the thumb.

## 2022-07-24 ENCOUNTER — Other Ambulatory Visit: Payer: Self-pay | Admitting: Family Medicine

## 2022-07-24 ENCOUNTER — Ambulatory Visit
Admission: RE | Admit: 2022-07-24 | Discharge: 2022-07-24 | Disposition: A | Discharge status: Home / Self Care | Payer: Medicare Other | Source: Ambulatory Visit | Attending: Family Medicine | Admitting: Family Medicine

## 2022-07-24 DIAGNOSIS — E2839 Other primary ovarian failure: Secondary | ICD-10-CM

## 2022-07-24 DIAGNOSIS — Z1231 Encounter for screening mammogram for malignant neoplasm of breast: Secondary | ICD-10-CM

## 2022-07-24 DIAGNOSIS — M7750 Other enthesopathy of unspecified foot: Secondary | ICD-10-CM

## 2022-08-31 ENCOUNTER — Ambulatory Visit: Payer: Medicare Other

## 2022-08-31 ENCOUNTER — Ambulatory Visit: Admission: RE | Admit: 2022-08-31 | Payer: Medicare Other | Source: Ambulatory Visit

## 2022-10-06 ENCOUNTER — Ambulatory Visit
Admission: RE | Admit: 2022-10-06 | Discharge: 2022-10-06 | Disposition: A | Discharge status: Home / Self Care | Payer: Medicare Other | Source: Ambulatory Visit | Attending: Family Medicine | Admitting: Family Medicine

## 2022-10-06 DIAGNOSIS — Z1231 Encounter for screening mammogram for malignant neoplasm of breast: Secondary | ICD-10-CM

## 2022-10-06 DIAGNOSIS — E2839 Other primary ovarian failure: Secondary | ICD-10-CM

## 2022-10-10 ENCOUNTER — Other Ambulatory Visit: Payer: Self-pay | Admitting: Family Medicine

## 2022-10-10 DIAGNOSIS — R928 Other abnormal and inconclusive findings on diagnostic imaging of breast: Secondary | ICD-10-CM

## 2022-10-19 ENCOUNTER — Other Ambulatory Visit: Payer: Self-pay | Admitting: Family Medicine

## 2022-10-19 ENCOUNTER — Ambulatory Visit
Admission: RE | Admit: 2022-10-19 | Discharge: 2022-10-19 | Disposition: A | Discharge status: Home / Self Care | Payer: Medicare Other | Source: Ambulatory Visit | Attending: Family Medicine | Admitting: Family Medicine

## 2022-10-19 DIAGNOSIS — R928 Other abnormal and inconclusive findings on diagnostic imaging of breast: Secondary | ICD-10-CM

## 2022-10-19 DIAGNOSIS — N6489 Other specified disorders of breast: Secondary | ICD-10-CM

## 2022-10-26 ENCOUNTER — Ambulatory Visit (HOSPITAL_BASED_OUTPATIENT_CLINIC_OR_DEPARTMENT_OTHER): Payer: Medicare Other | Admitting: Psychiatry

## 2022-10-26 DIAGNOSIS — F4323 Adjustment disorder with mixed anxiety and depressed mood: Secondary | ICD-10-CM | POA: Diagnosis not present
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
## 2022-10-30 ENCOUNTER — Ambulatory Visit
Admission: RE | Admit: 2022-10-30 | Discharge: 2022-10-30 | Disposition: A | Discharge status: Home / Self Care | Payer: Medicare Other | Source: Ambulatory Visit | Attending: Family Medicine | Admitting: Family Medicine

## 2022-10-30 DIAGNOSIS — N6489 Other specified disorders of breast: Secondary | ICD-10-CM

## 2022-10-30 DIAGNOSIS — R928 Other abnormal and inconclusive findings on diagnostic imaging of breast: Secondary | ICD-10-CM

## 2022-10-30 HISTORY — PX: BREAST BIOPSY: SHX20

## 2023-02-16 ENCOUNTER — Other Ambulatory Visit: Payer: Self-pay | Admitting: Family Medicine

## 2023-02-16 ENCOUNTER — Ambulatory Visit
Admission: RE | Admit: 2023-02-16 | Discharge: 2023-02-16 | Disposition: A | Discharge status: Home / Self Care | Payer: Medicare Other | Source: Ambulatory Visit | Attending: Family Medicine | Admitting: Family Medicine

## 2023-02-16 DIAGNOSIS — G8929 Other chronic pain: Secondary | ICD-10-CM

## 2023-05-30 ENCOUNTER — Other Ambulatory Visit: Payer: Self-pay | Admitting: Family Medicine

## 2023-05-30 DIAGNOSIS — M4800 Spinal stenosis, site unspecified: Secondary | ICD-10-CM

## 2023-06-11 ENCOUNTER — Ambulatory Visit
Admission: RE | Admit: 2023-06-11 | Discharge: 2023-06-11 | Disposition: A | Discharge status: Home / Self Care | Payer: Medicare Other | Source: Ambulatory Visit | Attending: Family Medicine | Admitting: Family Medicine

## 2023-06-11 ENCOUNTER — Other Ambulatory Visit: Payer: Self-pay | Admitting: Family Medicine

## 2023-06-11 DIAGNOSIS — M79603 Pain in arm, unspecified: Secondary | ICD-10-CM

## 2023-06-11 DIAGNOSIS — M545 Low back pain, unspecified: Secondary | ICD-10-CM

## 2023-06-11 DIAGNOSIS — M4800 Spinal stenosis, site unspecified: Secondary | ICD-10-CM

## 2023-09-10 NOTE — Therapy (Signed)
OUTPATIENT PHYSICAL THERAPY THORACOLUMBAR EVALUATION   Patient Name: Brooke Mccarthy MRN: [id number] DOB:25-Jul-1956, 67 y.o., female Today's Date: 09/11/2023  END OF SESSION:  PT End of Session - 09/11/23 1545     Visit Number 1    Number of Visits 6    Date for PT Re-Evaluation 11/12/23    Authorization Type [employer] MCR    PT Start Time 1430    PT Stop Time 1515    PT Time Calculation (min) 45 min    Activity Tolerance Patient tolerated treatment well;Patient limited by pain    Behavior During Therapy WFL for tasks assessed/performed             Past Medical History:  Diagnosis Date   Anxiety    Arthritis    Chronic back pain    FH: restless leg syndrome    GERD (gastroesophageal reflux disease)    History of gallstones    Hyperlipidemia    Hypertension    Hypothyroidism    PONV (postoperative nausea and vomiting)    Past Surgical History:  Procedure Laterality Date   BACK SURGERY     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     BILATERAL FOOT SURGERY     BREAST BIOPSY Left 10/30/2022   MM LT BREAST BX W LOC DEV 1ST LESION IMAGE BX SPEC STEREO GUIDE 10/30/2022 [facility]   BREAST BIOPSY Right 10/30/2022   MM RT BREAST BX W LOC DEV 1ST LESION IMAGE BX SPEC STEREO GUIDE 10/30/2022 [facility]   BREAST EXCISIONAL BIOPSY Right    late 80's   BREAST EXCISIONAL BIOPSY Right    CHOLECYSTECTOMY N/A 09/11/2017   Procedure: LAPAROSCOPIC CHOLECYSTECTOMY;  Surgeon: Jimmye Norman, MD;  Location: [facility];  Service: General;  Laterality: N/A;   ERCP N/A 09/10/2017   Procedure: ENDOSCOPIC RETROGRADE CHOLANGIOPANCREATOGRAPHY (ERCP);  Surgeon: Iva Boop, MD;  [facility] END[facility];  Service: Endoscopy;  Laterality: N/A;   FEMUR IM NAIL Left 10/10/2013   Procedure: INTRAMEDULLARY (IM) RETROGRADE FEMORAL NAILING;  Surgeon: Toni Arthurs, MD;  Location: [facility];  Service: Orthopedics;  Laterality: Left;   TUBAL LIGATION     Patient Active Problem List   Diagnosis Date  Noted   Cholelithiasis with choledocholithiasis    Abnormal LFTs (liver function tests)    Biliary obstruction    Choledocholithiasis    Common bile duct dilation 09/08/2017   Hypokalemia 09/08/2017   Hyponatremia 09/08/2017   Leukocytosis 09/08/2017   Abdominal pain    Displaced spiral fracture of shaft of femur (HCC) 10/10/2013   Femur fracture, left (HCC) 10/10/2013    PCP: Ellyn Hack, MD   REFERRING PROVIDER: Barnett Abu, MD  REFERRING DIAG: M43.16 (ICD-10-CM) - Spondylolisthesis, lumbar region  Rationale for Evaluation and Treatment: Rehabilitation  THERAPY DIAG:  Other low back pain  Muscle weakness (generalized)  Spondylolisthesis of lumbar region  ONSET DATE: chronic  SUBJECTIVE:  SUBJECTIVE STATEMENT: Describes a history of chronic low back pain.  Has had previous surgeries 25 yrs ago and in 2015  PERTINENT HISTORY:    PAIN:  Are you having pain? Yes: NPRS scale: 10+/10 Pain location: low back Pain description: ache Aggravating factors: prolonged tasks and positioning Relieving factors: rest and percocet  PRECAUTIONS: None  RED FLAGS: None   WEIGHT BEARING RESTRICTIONS: No  FALLS:  Has patient fallen in last 6 months? No  OCCUPATION: retired  PLOF: Independent  PATIENT GOALS: To manage my back symptoms  NEXT MD VISIT: 1 week for ESI  OBJECTIVE:  Note: Objective measures were completed at Evaluation unless otherwise noted.  DIAGNOSTIC FINDINGS:  Disc spaces: Degenerative disease with disc height loss at T9-10, T10-11, T11-12, T12-L1, L1-2, L4-5 and L5-S1.   T9-10: Broad-based disc bulge. Mild bilateral facet arthropathy. Moderate right and mild left foraminal stenosis. No spinal stenosis.   T10-11: Broad-based disc bulge flattening the ventral  thecal sac. Bilateral moderate foraminal stenosis. No spinal stenosis.   T11-12: Mild broad-based disc bulge. No foraminal or central canal stenosis.   T12-L1: Mild broad-based disc bulge. Mild bilateral facet arthropathy. No foraminal or central canal stenosis.   L1-L2: Broad-based disc osteophyte complex flattening the ventral thecal sac. Mild bilateral facet arthropathy. Moderate spinal stenosis. Moderate left foraminal stenosis. Mild right foraminal stenosis.   L2-L3: Broad-based disc bulge flattening the ventral thecal sac. Moderate bilateral facet arthropathy. Mild spinal stenosis. Mild left foraminal stenosis. Moderate right foraminal stenosis.   L3-L4: Broad-based disc bulge. Moderate bilateral facet arthropathy. Severe spinal stenosis. Moderate bilateral foraminal stenosis.   L4-L5: Broad-based disc bulge with a right subarticular disc protrusion and mass effect on the right intraspinal L5 nerve root. Mild bilateral facet arthropathy. Mild right foraminal stenosis. No left foraminal stenosis. No spinal stenosis.   L5-S1: Broad-based disc bulge with a left subarticular disc protrusion contacting the left intraspinal S1 nerve root. Left laminotomy defect. Mild bilateral facet arthropathy. Moderate-severe left foraminal stenosis. No right foraminal stenosis.   IMPRESSION: 1. Diffuse lumbar spine spondylosis as described above. 2. No acute osseous injury of the lumbar spine.     Electronically Signed   By: Elige Ko M.D.   On: 06/29/2023 09:57  PATIENT SURVEYS:  FOTO 43( 52 predicted)   MUSCLE LENGTH: UTA  POSTURE:  flexed posture and lateral curvatures  PALPATION: Increased tension noted throughout lumbar paraspinals  LUMBAR ROM:   AROM eval  Flexion 25%  Extension 0%  Right lateral flexion   Left lateral flexion   Right rotation   Left rotation    (Blank rows = not tested)  LOWER EXTREMITY ROM:   WFL for gait and transfers  Active   Right eval Left eval  Hip flexion    Hip extension    Hip abduction    Hip adduction    Hip internal rotation    Hip external rotation    Knee flexion    Knee extension    Ankle dorsiflexion    Ankle plantarflexion    Ankle inversion    Ankle eversion     (Blank rows = not tested)  LOWER EXTREMITY MMT:    MMT Right eval Left eval  Hip flexion    Hip extension    Hip abduction    Hip adduction    Hip internal rotation    Hip external rotation    Knee flexion    Knee extension    Ankle dorsiflexion    Ankle plantarflexion  Ankle inversion    Ankle eversion     (Blank rows = not tested)  LUMBAR SPECIAL TESTS:  Straight leg raise test: Negative and Slump test: Negative  FUNCTIONAL TESTS:  30 seconds chair stand test  GAIT: Distance walked: 35ft x2 Assistive device utilized: None Level of assistance: Complete Independence Comments: slow antalgic pattern  TODAY'S TREATMENT:                                                                                                                              DATE: 09/11/23 Eval and HEP    PATIENT EDUCATION:  Education details: Discussed eval findings, rehab rationale and POC and patient is in agreement  Person educated: Patient Education method: Explanation Education comprehension: verbalized understanding and needs further education  HOME EXERCISE PROGRAM: Access Code: [facility] URL: [email or web address] Date: 09/11/2023 Prepared by: Gustavus Bryant  Exercises - Heel Toe Raises with Unilateral Counter Support  - 2 x daily - 5 x weekly - 1 sets - 10 reps - Seated Table Hamstring Stretch  - 2 x daily - 5 x weekly - 1 sets - 2 reps - 30s hold - Seated Sciatic Tensioner  - 2 x daily - 5 x weekly - 1 sets - 10 reps  ASSESSMENT:  CLINICAL IMPRESSION: Patient is a 67 y.o. female who was seen today for physical therapy evaluation and treatment for chronic low back pain. She is to undergo ESI in 1 week and  has been referred to OPPT to fulfill requirements to undergo recommended lumbar fusion.  She is reluctant to participate in OPPT due to financial reasons as well as unsuccessful conservative care in the past.  Patient was unable to tolerate various testing positions and objective data is sparse.  She was instructed in a HEP to address found deficits but has deferred to schedule f/u sessions until she obtains clarification regarding surgical prerequisites.  OBJECTIVE IMPAIRMENTS: Abnormal gait, decreased activity tolerance, decreased knowledge of condition, decreased mobility, difficulty walking, decreased ROM, decreased strength, postural dysfunction, and pain.   ACTIVITY LIMITATIONS: carrying, lifting, bending, sitting, standing, squatting, stairs, and locomotion level  PERSONAL FACTORS: Behavior pattern, Fitness, Past/current experiences, and Time since onset of injury/illness/exacerbation are also affecting patient's functional outcome.   REHAB POTENTIAL: Fair based on chronicity and advanced degenerative changes  CLINICAL DECISION MAKING: Evolving/moderate complexity  EVALUATION COMPLEXITY: Moderate   GOALS: Goals reviewed with patient? No  SHORT TERM GOALS=LONG TERM GOALS: Target date: 11/06/2023  Patient to demonstrate independence in HEP  Baseline: [id number] Goal status: INITIAL  2.  Patient will increase 30s chair stand reps from 1 to 2 with/without arms to demonstrate and improved functional ability with less pain/difficulty as well as reduce fall risk.  Baseline: 1 Goal status: INITIAL  3.  Patient will acknowledge 6/10 pain at least once during episode of care   Baseline: 10+/10 Goal status: INITIAL  4.  Increase AROM lumbar spine 50% flexion and 10% extension Baseline:  AROM eval  Flexion 25%  Extension 0%   Goal status: INITIAL  5.  Patient will score at least 52% on FOTO to signify clinically meaningful improvement in functional abilities.   Baseline: 43% Goal  status: INITIAL      PLAN:  PT FREQUENCY: 1x/week  PT DURATION: 6 weeks  PLANNED INTERVENTIONS: [id number]- PT Re-evaluation, 97110-Therapeutic exercises, 97530- Therapeutic activity, [id number]- Neuromuscular re-education, 97535- Self Care, [id number]- Manual therapy, [id number]- Gait training, [phone]- Aquatic Therapy, Dry Needling, Joint mobilization, Spinal mobilization, Cryotherapy, and Moist heat.  PLAN FOR NEXT SESSION: HEP review and update, manual techniques as appropriate, aerobic tasks, ROM and flexibility activities, strengthening and PREs, TPDN, gait and balance training as needed    Date of referral: 08/29/23 Referring provider: Barnett Abu, MD Referring diagnosis? M43.16 (ICD-10-CM) - Spondylolisthesis, lumbar region Treatment diagnosis? (if different than referring diagnosis) M43.16 (ICD-10-CM) - Spondylolisthesis, lumbar region  What was this (referring dx) caused by? Arthritis  Nature of Condition: Chronic (continuous duration > 3 months)   Laterality: Both  Current Functional Measure Score: FOTO 43%  Objective measurements identify impairments when they are compared to normal values, the uninvolved extremity, and prior level of function.  [x]  Yes  []  No  Objective assessment of functional ability: Severe functional limitations   Briefly describe symptoms: pain with prolonged positions or weight bearing activities  How did symptoms start: progressively  Average pain intensity:  Last 24 hours: 10  Past week: 10  How often does the pt experience symptoms? Constantly  How much have the symptoms interfered with usual daily activities? Extremely  How has condition changed since care began at this facility? NA - initial visit  In general, how is the patients overall health? Good   BACK PAIN (STarT Back Screening Tool) Has pain spread down the leg(s) at some time in the last 2 weeks? yes Has there been pain in the shoulder or neck at some time in the last 2 weeks? no Has  the pt only walked short distances because of back pain? y Has patient dressed more slowly because of back pain in the past 2 weeks? y Does patient think it's not safe for a person with this condition to be physically active? n Does patient have worrying thoughts a lot of the time? y Does patient feel back pain is terrible and will never get any better? y Has patient stopped enjoying things they usually enjoy? y Overall, how bothersome has back pain been in the last 2 weeks?                    Very Much  Hildred Laser, PT 09/11/2023, 3:46 PM

## 2023-09-11 ENCOUNTER — Ambulatory Visit: Payer: Medicare Other | Attending: Neurological Surgery

## 2023-09-11 ENCOUNTER — Other Ambulatory Visit: Payer: Self-pay

## 2023-09-11 DIAGNOSIS — M5459 Other low back pain: Secondary | ICD-10-CM | POA: Diagnosis present

## 2023-09-11 DIAGNOSIS — M4316 Spondylolisthesis, lumbar region: Secondary | ICD-10-CM | POA: Insufficient documentation

## 2023-09-11 DIAGNOSIS — M6281 Muscle weakness (generalized): Secondary | ICD-10-CM | POA: Insufficient documentation

## 2023-09-20 ENCOUNTER — Encounter: Payer: Self-pay | Admitting: Physical Therapy

## 2023-09-20 ENCOUNTER — Ambulatory Visit: Payer: Medicare Other | Admitting: Physical Therapy

## 2023-09-20 DIAGNOSIS — M5459 Other low back pain: Secondary | ICD-10-CM | POA: Diagnosis not present

## 2023-09-20 DIAGNOSIS — M6281 Muscle weakness (generalized): Secondary | ICD-10-CM

## 2023-09-20 NOTE — Therapy (Signed)
OUTPATIENT PHYSICAL THERAPY TREATMENT   Patient Name: Brooke Mccarthy MRN: [id number] DOB:1956/02/09, 67 y.o., female Today's Date: 09/20/2023  END OF SESSION:  PT End of Session - 09/20/23 1145     Visit Number 2    Number of Visits 6    Date for PT Re-Evaluation 11/12/23    Authorization Type [employer] MCR    Authorization Time Period auth tbd    PT Start Time 1146    PT Stop Time 1229    PT Time Calculation (min) 43 min              Past Medical History:  Diagnosis Date   Anxiety    Arthritis    Chronic back pain    FH: restless leg syndrome    GERD (gastroesophageal reflux disease)    History of gallstones    Hyperlipidemia    Hypertension    Hypothyroidism    PONV (postoperative nausea and vomiting)    Past Surgical History:  Procedure Laterality Date   BACK SURGERY     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     BILATERAL FOOT SURGERY     BREAST BIOPSY Left 10/30/2022   MM LT BREAST BX W LOC DEV 1ST LESION IMAGE BX SPEC STEREO GUIDE 10/30/2022 [facility]   BREAST BIOPSY Right 10/30/2022   MM RT BREAST BX W LOC DEV 1ST LESION IMAGE BX SPEC STEREO GUIDE 10/30/2022 [facility]   BREAST EXCISIONAL BIOPSY Right    late 80's   BREAST EXCISIONAL BIOPSY Right    CHOLECYSTECTOMY N/A 09/11/2017   Procedure: LAPAROSCOPIC CHOLECYSTECTOMY;  Surgeon: Jimmye Norman, MD;  Location: [facility] OR;  Service: General;  Laterality: N/A;   ERCP N/A 09/10/2017   Procedure: ENDOSCOPIC RETROGRADE CHOLANGIOPANCREATOGRAPHY (ERCP);  Surgeon: Iva Boop, MD;  Location: [facility] ENDOSCOPY;  Service: Endoscopy;  Laterality: N/A;   FEMUR IM NAIL Left 10/10/2013   Procedure: INTRAMEDULLARY (IM) RETROGRADE FEMORAL NAILING;  Surgeon: Toni Arthurs, MD;  Location: [facility] OR;  Service: Orthopedics;  Laterality: Left;   TUBAL LIGATION     Patient Active Problem List   Diagnosis Date Noted   Cholelithiasis with choledocholithiasis    Abnormal LFTs (liver function tests)    Biliary obstruction     Choledocholithiasis    Common bile duct dilation 09/08/2017   Hypokalemia 09/08/2017   Hyponatremia 09/08/2017   Leukocytosis 09/08/2017   Abdominal pain    Displaced spiral fracture of shaft of femur (HCC) 10/10/2013   Femur fracture, left (HCC) 10/10/2013    PCP: Ellyn Hack, MD   REFERRING PROVIDER: Barnett Abu, MD  REFERRING DIAG: M43.16 (ICD-10-CM) - Spondylolisthesis, lumbar region  Rationale for Evaluation and Treatment: Rehabilitation  THERAPY DIAG:  Other low back pain  Muscle weakness (generalized)  ONSET DATE: chronic  SUBJECTIVE:  Per eval - Describes a history of chronic low back pain.  Has had previous surgeries 25 yrs ago and in 2015  SUBJECTIVE STATEMENT: 09/20/2023 Pt states she got some injections on the 19th - had excellent relief initially, seems to be wearing off a bit but still improved. 6/10 at present. States HEP is going okay, doesn't really notice any change in symptoms with it. No other new updates.    PERTINENT HISTORY:  Per paper referral:  PAIN:  Are you having pain? Yes: NPRS scale: 6/10 Pain location: low back Pain description: ache Aggravating factors: prolonged tasks and positioning Relieving factors: rest and percocet  PRECAUTIONS: None  RED FLAGS: None   WEIGHT BEARING RESTRICTIONS: No  FALLS:  Has patient fallen in last 6 months? No  OCCUPATION: retired  PLOF: Independent  PATIENT GOALS: To manage my back symptoms  NEXT MD VISIT: after PT per pt report   OBJECTIVE:  Note: Objective measures were completed at Evaluation unless otherwise noted.  DIAGNOSTIC FINDINGS:  Disc spaces: Degenerative disease with disc height loss at T9-10, T10-11, T11-12, T12-L1, L1-2, L4-5 and L5-S1.   T9-10: Broad-based disc bulge. Mild bilateral  facet arthropathy. Moderate right and mild left foraminal stenosis. No spinal stenosis.   T10-11: Broad-based disc bulge flattening the ventral thecal sac. Bilateral moderate foraminal stenosis. No spinal stenosis.   T11-12: Mild broad-based disc bulge. No foraminal or central canal stenosis.   T12-L1: Mild broad-based disc bulge. Mild bilateral facet arthropathy. No foraminal or central canal stenosis.   L1-L2: Broad-based disc osteophyte complex flattening the ventral thecal sac. Mild bilateral facet arthropathy. Moderate spinal stenosis. Moderate left foraminal stenosis. Mild right foraminal stenosis.   L2-L3: Broad-based disc bulge flattening the ventral thecal sac. Moderate bilateral facet arthropathy. Mild spinal stenosis. Mild left foraminal stenosis. Moderate right foraminal stenosis.   L3-L4: Broad-based disc bulge. Moderate bilateral facet arthropathy. Severe spinal stenosis. Moderate bilateral foraminal stenosis.   L4-L5: Broad-based disc bulge with a right subarticular disc protrusion and mass effect on the right intraspinal L5 nerve root. Mild bilateral facet arthropathy. Mild right foraminal stenosis. No left foraminal stenosis. No spinal stenosis.   L5-S1: Broad-based disc bulge with a left subarticular disc protrusion contacting the left intraspinal S1 nerve root. Left laminotomy defect. Mild bilateral facet arthropathy. Moderate-severe left foraminal stenosis. No right foraminal stenosis.   IMPRESSION: 1. Diffuse lumbar spine spondylosis as described above. 2. No acute osseous injury of the lumbar spine.     Electronically Signed   By: Elige Ko M.D.   On: 06/29/2023 09:57  PATIENT SURVEYS:  FOTO 43( 52 predicted)   MUSCLE LENGTH: UTA  POSTURE:  flexed posture and lateral curvatures  PALPATION: Increased tension noted throughout lumbar paraspinals  LUMBAR ROM:   AROM eval  Flexion 25%  Extension 0%  Right lateral flexion   Left lateral  flexion   Right rotation   Left rotation    (Blank rows = not tested)  LOWER EXTREMITY ROM:   WFL for gait and transfers  Active  Right eval Left eval  Hip flexion    Hip extension    Hip abduction    Hip adduction    Hip internal rotation    Hip external rotation    Knee flexion    Knee extension    Ankle dorsiflexion    Ankle plantarflexion    Ankle inversion    Ankle eversion     (Blank rows = not tested)  LOWER EXTREMITY MMT:  MMT Right eval Left eval  Hip flexion    Hip extension    Hip abduction    Hip adduction    Hip internal rotation    Hip external rotation    Knee flexion    Knee extension    Ankle dorsiflexion    Ankle plantarflexion    Ankle inversion    Ankle eversion     (Blank rows = not tested)  LUMBAR SPECIAL TESTS:  Straight leg raise test: Negative and Slump test: Negative  FUNCTIONAL TESTS:  30 seconds chair stand test  GAIT: Distance walked: 49ft x2 Assistive device utilized: None Level of assistance: Complete Independence Comments: slow antalgic pattern  TODAY'S TREATMENT:                                                                                                                              [facility] Adult PT Treatment:                                                DATE: 09/20/23 Therapeutic Exercise: Seated hamstring stretch 2x30sec BIL  Sciatic nerve glide 2x8 BIL cues for form  Standing heel/toe raises x10 cues for posture Seated red band paloff press x10 BIL Seated adduction isometric x10, x15 cues for appropriate force output, breath control  STS from chair 2x5 HEP update + education/handout, time spent w/ education re: rationale for interventions    PATIENT EDUCATION:  Education details: rationale for interventions, HEP  Person educated: Patient Education method: Explanation, Demonstration, Tactile cues, Verbal cues Education comprehension: verbalized understanding, returned demonstration, verbal cues required,  tactile cues required, and needs further education     HOME EXERCISE PROGRAM: Access Code: [facility] URL: [email or web address] Date: 09/20/2023 Prepared by: Fransisco Hertz  Exercises - Heel Toe Raises with Unilateral Counter Support  - 2 x daily - 5 x weekly - 1 sets - 10 reps - Seated Table Hamstring Stretch  - 2 x daily - 5 x weekly - 1 sets - 2 reps - 30s hold - Seated Sciatic Tensioner  - 2 x daily - 5 x weekly - 1 sets - 10 reps - Sit to Stand with Armchair  - 2-3 x daily - 5 x weekly - 1 sets - 5 reps - Seated Hip Adduction Isometrics with Ball  - 2-3 x daily - 5 x weekly - 1 sets - 10 reps  ASSESSMENT:  CLINICAL IMPRESSION: 09/20/2023 Pt arrives w/ 6/10 pain noting relief with injections last week. Does well with HEP review today, able to progress for additional core/LE strengthening. Some muscular fatigue reported, particularly with paloff press, but no increase in resting pain, tolerates session well overall. No adverse events, denies change in pain on departure. HEP update as above. Recommend continuing along current POC in order to address relevant deficits and improve  functional tolerance. Pt departs today's session in no acute distress, all voiced questions/concerns addressed appropriately from PT perspective.    Per eval - Patient is a 67 y.o. female who was seen today for physical therapy evaluation and treatment for chronic low back pain. She is to undergo ESI in 1 week and has been referred to OPPT to fulfill requirements to undergo recommended lumbar fusion.  She is reluctant to participate in OPPT due to financial reasons as well as unsuccessful conservative care in the past.  Patient was unable to tolerate various testing positions and objective data is sparse.  She was instructed in a HEP to address found deficits but has deferred to schedule f/u sessions until she obtains clarification regarding surgical prerequisites.  OBJECTIVE IMPAIRMENTS: Abnormal gait,  decreased activity tolerance, decreased knowledge of condition, decreased mobility, difficulty walking, decreased ROM, decreased strength, postural dysfunction, and pain.   ACTIVITY LIMITATIONS: carrying, lifting, bending, sitting, standing, squatting, stairs, and locomotion level  PERSONAL FACTORS: Behavior pattern, Fitness, Past/current experiences, and Time since onset of injury/illness/exacerbation are also affecting patient's functional outcome.   REHAB POTENTIAL: Fair based on chronicity and advanced degenerative changes  CLINICAL DECISION MAKING: Evolving/moderate complexity  EVALUATION COMPLEXITY: Moderate   GOALS: Goals reviewed with patient? No  SHORT TERM GOALS=LONG TERM GOALS: Target date: 11/06/2023  Patient to demonstrate independence in HEP  Baseline: [id number] Goal status: INITIAL  2.  Patient will increase 30s chair stand reps from 1 to 2 with/without arms to demonstrate and improved functional ability with less pain/difficulty as well as reduce fall risk.  Baseline: 1 Goal status: INITIAL  3.  Patient will acknowledge 6/10 pain at least once during episode of care   Baseline: 10+/10 Goal status: INITIAL  4.  Increase AROM lumbar spine 50% flexion and 10% extension Baseline:  AROM eval  Flexion 25%  Extension 0%   Goal status: INITIAL  5.  Patient will score at least 52% on FOTO to signify clinically meaningful improvement in functional abilities.   Baseline: 43% Goal status: INITIAL      PLAN:  PT FREQUENCY: 1x/week  PT DURATION: 6 weeks  PLANNED INTERVENTIONS: 97164- PT Re-evaluation, 97110-Therapeutic exercises, 97530- Therapeutic activity, [id number]- Neuromuscular re-education, 97535- Self Care, [id number]- Manual therapy, [id number]- Gait training, [phone]- Aquatic Therapy, Dry Needling, Joint mobilization, Spinal mobilization, Cryotherapy, and Moist heat.  PLAN FOR NEXT SESSION: HEP review and update, manual techniques as appropriate, aerobic tasks, ROM and  flexibility activities, strengthening and PREs, TPDN, gait and balance training as needed    Ashley Murrain PT, DPT 09/20/2023 12:34 PM

## 2023-09-23 ENCOUNTER — Emergency Department (HOSPITAL_COMMUNITY)
Admission: EM | Admit: 2023-09-23 | Discharge: 2023-09-23 | Disposition: A | Discharge status: Home / Self Care | Payer: Medicare Other | Attending: Emergency Medicine | Admitting: Emergency Medicine

## 2023-09-23 ENCOUNTER — Encounter (HOSPITAL_COMMUNITY): Payer: Self-pay

## 2023-09-23 ENCOUNTER — Emergency Department (HOSPITAL_COMMUNITY): Payer: Medicare Other

## 2023-09-23 ENCOUNTER — Other Ambulatory Visit: Payer: Self-pay

## 2023-09-23 DIAGNOSIS — Z20822 Contact with and (suspected) exposure to covid-19: Secondary | ICD-10-CM | POA: Diagnosis not present

## 2023-09-23 DIAGNOSIS — I10 Essential (primary) hypertension: Secondary | ICD-10-CM | POA: Insufficient documentation

## 2023-09-23 DIAGNOSIS — J441 Chronic obstructive pulmonary disease with (acute) exacerbation: Secondary | ICD-10-CM | POA: Diagnosis not present

## 2023-09-23 DIAGNOSIS — R0602 Shortness of breath: Secondary | ICD-10-CM | POA: Diagnosis present

## 2023-09-23 DIAGNOSIS — E039 Hypothyroidism, unspecified: Secondary | ICD-10-CM | POA: Diagnosis not present

## 2023-09-23 DIAGNOSIS — J40 Bronchitis, not specified as acute or chronic: Secondary | ICD-10-CM | POA: Insufficient documentation

## 2023-09-23 DIAGNOSIS — Z79899 Other long term (current) drug therapy: Secondary | ICD-10-CM | POA: Insufficient documentation

## 2023-09-23 DIAGNOSIS — Z7952 Long term (current) use of systemic steroids: Secondary | ICD-10-CM | POA: Diagnosis not present

## 2023-09-23 LAB — BASIC METABOLIC PANEL
Anion gap: 11 (ref 5–15)
BUN: 28 mg/dL — ABNORMAL HIGH (ref 8–23)
CO2: 22 mmol/L (ref 22–32)
Calcium: 8.8 mg/dL — ABNORMAL LOW (ref 8.9–10.3)
Chloride: 101 mmol/L (ref 98–111)
Creatinine, Ser: 0.84 mg/dL (ref 0.44–1.00)
GFR, Estimated: >60 mL/min (ref >60)
Glucose, Bld: 208 mg/dL — ABNORMAL HIGH (ref 70–99)
Potassium: 3.3 mmol/L — ABNORMAL LOW (ref 3.5–5.1)
Sodium: 134 mmol/L — ABNORMAL LOW (ref 135–145)

## 2023-09-23 LAB — CBC
HCT: 47.5 % — ABNORMAL HIGH (ref 36.0–46.0)
Hemoglobin: 15.4 g/dL — ABNORMAL HIGH (ref 12.0–15.0)
MCH: 27 pg (ref 26.0–34.0)
MCHC: 32.4 g/dL (ref 30.0–36.0)
MCV: 83.2 fL (ref 80.0–100.0)
Platelets: 257 10*3/uL (ref 150–400)
RBC: 5.71 MIL/uL — ABNORMAL HIGH (ref 3.87–5.11)
RDW: 14 % (ref 11.5–15.5)
WBC: 16.5 10*3/uL — ABNORMAL HIGH (ref 4.0–10.5)
nRBC: 0 % (ref 0.0–0.2)

## 2023-09-23 LAB — RESP PANEL BY RT-PCR (RSV, FLU A&B, COVID)  RVPGX2
Influenza A by PCR: NEGATIVE
Influenza B by PCR: NEGATIVE
Resp Syncytial Virus by PCR: NEGATIVE
SARS Coronavirus 2 by RT PCR: NEGATIVE

## 2023-09-23 MED ORDER — ALBUTEROL SULFATE HFA 108 (90 BASE) MCG/ACT IN AERS: 2.0000 | INHALATION_SPRAY | RESPIRATORY_TRACT | 0 refills | Status: AC | PRN

## 2023-09-23 MED ORDER — IPRATROPIUM-ALBUTEROL 0.5-2.5 (3) MG/3ML IN SOLN
3.0000 mL | RESPIRATORY_TRACT | Status: AC
  Administered 2023-09-23 (×2): 3 mL via RESPIRATORY_TRACT
  Filled 2023-09-23: qty 6

## 2023-09-23 MED ORDER — ALBUTEROL SULFATE HFA 108 (90 BASE) MCG/ACT IN AERS
2.0000 | INHALATION_SPRAY | Freq: Once | RESPIRATORY_TRACT | Status: AC
  Administered 2023-09-23: 2 via RESPIRATORY_TRACT
  Filled 2023-09-23: qty 6.7

## 2023-09-23 MED ORDER — PREDNISONE 20 MG PO TABS
60.0000 mg | ORAL_TABLET | Freq: Once | ORAL | Status: AC
  Administered 2023-09-23: 60 mg via ORAL
  Filled 2023-09-23: qty 3

## 2023-09-23 MED ORDER — PREDNISONE 10 MG PO TABS: 40.0000 mg | ORAL_TABLET | Freq: Every day | ORAL | 0 refills | Status: DC

## 2023-09-23 MED ORDER — AZITHROMYCIN 250 MG PO TABS
500.0000 mg | ORAL_TABLET | Freq: Once | ORAL | Status: AC
  Administered 2023-09-23: 500 mg via ORAL
  Filled 2023-09-23: qty 2

## 2023-09-23 MED ORDER — AZITHROMYCIN 250 MG PO TABS: 250.0000 mg | ORAL_TABLET | Freq: Every day | ORAL | 0 refills | Status: DC

## 2023-09-23 MED ORDER — POTASSIUM CHLORIDE CRYS ER 20 MEQ PO TBCR
40.0000 meq | EXTENDED_RELEASE_TABLET | Freq: Once | ORAL | Status: AC
  Administered 2023-09-23: 40 meq via ORAL
  Filled 2023-09-23: qty 2

## 2023-09-23 MED ORDER — IPRATROPIUM-ALBUTEROL 0.5-2.5 (3) MG/3ML IN SOLN: 3.0000 mL | RESPIRATORY_TRACT | Status: DC

## 2023-09-23 NOTE — Discharge Instructions (Signed)
You were seen in the emergency department for your shortness of breath.  You did have significant wheezing consistent with a COPD exacerbation.  You had no signs of pneumonia on your chest x-ray and tested negative for COVID, flu and RSV.  You likely do have bronchitis.  I have given you a course of steroids and antibiotics and you should complete this as prescribed as well as an albuterol inhaler.  You should use your inhaler every 4 hours for the next 24 hours while awake and then can use as needed.  You should follow-up with your primary doctor in the next few days to have your symptoms rechecked.  You should return to the emergency department for significantly worsening shortness of breath, fevers despite the antibiotics, severe chest pain or any other new or concerning symptoms.

## 2023-09-23 NOTE — ED Provider Notes (Signed)
Brooke Mccarthy   Brooke Mccarthy: Brooke Mccarthy Arrival date & time: 09/23/23  1338     History  Chief Complaint  Patient presents with   Shortness of Breath    Brooke Mccarthy is a 67 y.o. female.  Patient is a 67 year old female with a past medical history of COPD, hypertension, hypothyroidism and anxiety presenting to the emergency department with shortness of breath.  The patient states when she was going to bed last night she had a spell of shortness of breath.  She states that she used her friend's albuterol with some relief.  She states that this morning she was walking into her friend's house her shortness of breath returned.  She states that she has been short of breath on exertion.  She does get relief with albuterol but does not have her own inhaler at home.  She states that she does have a cough productive of mucus.  Denies any fevers.  Denies any chest pain or lower extremity swelling.  States that she has mild associated sore throat.   Shortness of Breath      Home Medications Prior to Admission medications   Medication Sig Start Date End Date Taking? Authorizing Provider  albuterol (VENTOLIN HFA) 108 (90 Base) MCG/ACT inhaler Inhale 2 puffs into the lungs every 4 (four) hours as needed for wheezing or shortness of breath. 09/23/23  Yes Theresia Lo, [place] K, DO  azithromycin (ZITHROMAX) 250 MG tablet Take 1 tablet (250 mg total) by mouth daily. Take 1 every day until finished. 09/23/23  Yes Theresia Lo, [place] K, DO  predniSONE (DELTASONE) 10 MG tablet Take 4 tablets (40 mg total) by mouth daily. 09/23/23  Yes Theresia Lo, [place] K, DO  ALPRAZolam Prudy Feeler) 1 MG tablet Take 1-2 tablets by mouth 2 (two) times daily.  10/01/13   [provider]  buPROPion (WELLBUTRIN XL) 300 MG 24 hr tablet Take 300 mg by mouth daily. 08/30/17   [provider]  diltiazem (DILACOR XR) 240 MG 24 hr capsule Take 240 mg by mouth daily.     [provider]  ibuprofen (ADVIL,MOTRIN) 800 MG tablet Take 1 tablet (800 mg total) by mouth every 8 (eight) hours as needed. 09/12/17   Noralee Stain, DO  levothyroxine (SYNTHROID, LEVOTHROID) 100 MCG tablet Take 100 mcg by mouth daily.  10/01/13   [provider]  Magnesium Gluconate 550 MG TABS Take 30 mg by mouth.    [provider]  meloxicam (MOBIC) 15 MG tablet Take 15 mg by mouth daily.    [provider]  oxyCODONE-acetaminophen (PERCOCET) 10-325 MG tablet Take 1 tablet by mouth every 4 (four) hours as needed for pain.    [provider]  pravastatin (PRAVACHOL) 80 MG tablet Take 80 mg by mouth daily.    [provider]  spironolactone-hydrochlorothiazide (ALDACTAZIDE) 25-25 MG per tablet Take 1 tablet by mouth daily. 10/01/13   [provider]      Allergies    Hydrocodone-acetaminophen and Ultram [tramadol]    Review of Systems   Review of Systems  Respiratory:  Positive for shortness of breath.     Physical Exam Updated Vital Signs BP (!) 139/96 (BP Location: Right Arm)   Pulse 62   Temp 98.8 F (37.1 C) (Oral)   Resp 18   Ht 4\' 11"  (1.499 m)   Wt 77.1 kg   SpO2 93%   BMI 34.34 kg/m  Physical Exam Vitals and nursing Mccarthy reviewed.  Constitutional:      General: She is not in acute distress.    Appearance: She is well-developed.  HENT:     Head: Normocephalic and atraumatic.     Mouth/Throat:     Mouth: Mucous membranes are moist.  Eyes:     Extraocular Movements: Extraocular movements intact.  Cardiovascular:     Rate and Rhythm: Normal rate and regular rhythm.  Pulmonary:     Effort: Pulmonary effort is normal.     Breath sounds: Wheezing (Diffuse expiratory wheeze bilaterally) present.  Abdominal:     Palpations: Abdomen is soft.     Tenderness: There is no abdominal tenderness.  Musculoskeletal:        General: Normal range of motion.     Cervical back: Normal range of motion and neck  supple.     Right lower leg: No edema.     Left lower leg: No edema.  Skin:    General: Skin is warm and dry.  Neurological:     General: No focal deficit present.     Mental Status: She is alert and oriented to person, place, and time.  Psychiatric:        Mood and Affect: Mood normal.        Behavior: Behavior normal.     ED Results / Procedures / Treatments   Labs (all labs ordered are listed, but only abnormal results are displayed) Labs Reviewed  CBC - Abnormal; Notable for the following components:      Result Value   WBC 16.5 (*)    RBC 5.71 (*)    Hemoglobin 15.4 (*)    HCT 47.5 (*)    All other components within normal limits  BASIC METABOLIC PANEL - Abnormal; Notable for the following components:   Sodium 134 (*)    Potassium 3.3 (*)    Glucose, Bld 208 (*)    BUN 28 (*)    Calcium 8.8 (*)    All other components within normal limits  RESP PANEL BY RT-PCR (RSV, FLU A&B, COVID)  [id number]    EKG EKG Interpretation Date/Time:  Sunday September 23 2023 14:07:39 EST Ventricular Rate:  73 PR Interval:  118 QRS Duration:  95 QT Interval:  384 QTC Calculation: 424 R Axis:   60  Text Interpretation: Ectopic atrial rhythm Borderline short PR interval Baseline wander in lead(s) V1 No significant change since last tracing Confirmed by Elayne Snare ([id number]) on 09/23/2023 8:08:31 PM  Radiology DG Chest 2 View Result Date: 09/23/2023 CLINICAL DATA:  Shortness of breath.  Cough. EXAM: CHEST - 2 VIEW COMPARISON:  09/09/2017. FINDINGS: Bilateral lung fields are clear. Bilateral costophrenic angles are clear. Normal cardio-mediastinal silhouette. No acute osseous abnormalities. The soft tissues are within normal limits. IMPRESSION: No active cardiopulmonary disease. Electronically Signed   By: Jules Schick M.D.   On: 09/23/2023 14:18    Procedures Procedures    Medications Ordered in ED Medications  albuterol (VENTOLIN HFA) 108 (90 Base) MCG/ACT inhaler 2 puff (has  no administration in time range)  predniSONE (DELTASONE) tablet 60 mg (60 mg Oral Given 09/23/23 2049)  azithromycin (ZITHROMAX) tablet 500 mg (500 mg Oral Given 09/23/23 2049)  ipratropium-albuterol (DUONEB) 0.5-2.5 (3) MG/3ML nebulizer solution 3 mL (3 mLs Nebulization Given 09/23/23 2106)  potassium chloride SA (KLOR-CON M) CR tablet 40 mEq (40 mEq Oral Given 09/23/23 2115)    ED Course/ Medical Decision Making/ A&P Clinical Course as of 09/23/23 2213  Sun Sep 23, 2023  2209 Upon reassessment, the patient has improvement of her wheezing but still has some expiratory wheeze.  She states that she feels significantly improved and would like to be discharged home.  Patient will be given albuterol inhaler and steroid and antibiotic course and was recommended close primary care follow-up.  She was given strict return precautions. [VK]    Clinical Course User Index [VK] Rexford Maus, DO                                 Medical Decision Making This patient presents to the ED with chief complaint(s) of shortness of breath with pertinent past medical history of COPD, hypertension, hypothyroidism, anxiety which further complicates the presenting complaint. The complaint involves an extensive differential diagnosis and also carries with it a high risk of complications and morbidity.    The differential diagnosis includes patient does have wheezing on exam making COPD exacerbation likely, considering bronchitis, pneumonia, pneumothorax, pulmonary edema, pleural effusion, viral syndrome, ACS, arrhythmia, no signs of volume overload  Additional history obtained: Additional history obtained from family Records reviewed N/A  ED Course and Reassessment: On patient's arrival she was hemodynamically stable in no acute distress.  Did receive 1 DuoNeb and route by EMS.  Had labs performed in triage that showed leukocytosis and mild hyperglycemia and hypokalemia.  Chest x-ray is without acute disease,  viral swab is negative.  EKG showed normal sinus rhythm without acute ischemic changes.  She does have significant wheezing consistent with COPD exacerbation and will be treated with additional nebs, steroids and azithromycin and she will be closely reassessed.  Independent labs interpretation:  The following labs were independently interpreted: leukocytosis, mild hypokalemia otherwise within normal range  Independent visualization of imaging: - I independently visualized the following imaging with scope of interpretation limited to determining acute life threatening conditions related to emergency care: CXR, which revealed no acute disease  Consultation: - Consulted or discussed management/test interpretation w/ external professional: N/A  Consideration for admission or further workup: Patient has no emergent conditions requiring admission or further work-up at this time and is stable for discharge home with primary care follow-up  Social Determinants of health: N/A    Amount and/or Complexity of Data Reviewed Labs: ordered. Radiology: ordered.  Risk Prescription drug management.           Final Clinical Impression(s) / ED Diagnoses Final diagnoses:  COPD exacerbation (HCC)  Bronchitis    Rx / DC Orders ED Discharge Orders          Ordered    albuterol (VENTOLIN HFA) 108 (90 Base) MCG/ACT inhaler  Every 4 hours PRN        09/23/23 2210    predniSONE (DELTASONE) 10 MG tablet  Daily        09/23/23 2210    azithromycin (ZITHROMAX) 250 MG tablet  Daily        09/23/23 2210              Rexford Maus, DO 09/23/23 2213

## 2023-09-23 NOTE — ED Triage Notes (Signed)
Pt BIB [facility] coming from home. Pt presents with a cough that started yesterday followed by progressive worsening of ShOB. EMS report pt is tachypnea with wheezes bilat. EMS admin 1 Duoneb with some improvement. Pt is currently on 2L O2 via N/C  EMS Vitals  HR 70 SpO2 94% on R/A RR 30 ETCO2 30 130/60

## 2023-09-27 NOTE — Therapy (Signed)
 OUTPATIENT PHYSICAL THERAPY TREATMENT   Patient Name: Brooke Mccarthy MRN: [id number] DOB:1956-03-07, 68 y.o., female Today's Date: 09/28/2023  END OF SESSION:  PT End of Session - 09/28/23 1001     Visit Number 3    Number of Visits 6    Date for PT Re-Evaluation 11/12/23    Authorization Type UHC MCR    Authorization Time Period auth tbd    PT Start Time 1001    PT Stop Time 1039    PT Time Calculation (min) 38 min    Activity Tolerance Patient tolerated treatment well;Patient limited by pain    Behavior During Therapy WFL for tasks assessed/performed               Past Medical History:  Diagnosis Date   Anxiety    Arthritis    Chronic back pain    FH: restless leg syndrome    GERD (gastroesophageal reflux disease)    History of gallstones    Hyperlipidemia    Hypertension    Hypothyroidism    PONV (postoperative nausea and vomiting)    Past Surgical History:  Procedure Laterality Date   BACK SURGERY     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     BILATERAL FOOT SURGERY     BREAST BIOPSY Left 10/30/2022   MM LT BREAST BX W LOC DEV 1ST LESION IMAGE BX SPEC STEREO GUIDE 10/30/2022 [facility]   BREAST BIOPSY Right 10/30/2022   MM RT BREAST BX W LOC DEV 1ST LESION IMAGE BX SPEC STEREO GUIDE 10/30/2022 [facility]   BREAST EXCISIONAL BIOPSY Right    late 80's   BREAST EXCISIONAL BIOPSY Right    CHOLECYSTECTOMY N/A 09/11/2017   Procedure: LAPAROSCOPIC CHOLECYSTECTOMY;  Surgeon: Kimble Agent, MD;  Location: [facility] OR;  Service: General;  Laterality: N/A;   ERCP N/A 09/10/2017   Procedure: ENDOSCOPIC RETROGRADE CHOLANGIOPANCREATOGRAPHY (ERCP);  Surgeon: Avram Lupita BRAVO, MD;  Location: [facility] ENDOSCOPY;  Service: Endoscopy;  Laterality: N/A;   FEMUR IM NAIL Left 10/10/2013   Procedure: INTRAMEDULLARY (IM) RETROGRADE FEMORAL NAILING;  Surgeon: Norleen Armor, MD;  Location: [facility] OR;  Service: Orthopedics;  Laterality: Left;   TUBAL LIGATION     Patient Active Problem  List   Diagnosis Date Noted   Cholelithiasis with choledocholithiasis    Abnormal LFTs (liver function tests)    Biliary obstruction    Choledocholithiasis    Common bile duct dilation 09/08/2017   Hypokalemia 09/08/2017   Hyponatremia 09/08/2017   Leukocytosis 09/08/2017   Abdominal pain    Displaced spiral fracture of shaft of femur (HCC) 10/10/2013   Femur fracture, left (HCC) 10/10/2013    PCP: Maree Leni Edyth DELENA, MD   REFERRING PROVIDER: Colon Shove, MD  REFERRING DIAG: M43.16 (ICD-10-CM) - Spondylolisthesis, lumbar region  Rationale for Evaluation and Treatment: Rehabilitation  THERAPY DIAG:  Other low back pain  Muscle weakness (generalized)  Spondylolisthesis of lumbar region  ONSET DATE: chronic  SUBJECTIVE:  Per eval - Describes a history of chronic low back pain.  Has had previous surgeries 25 yrs ago and in 2015  SUBJECTIVE STATEMENT: Doing better.  Had an episode of bronchitis and experienced a coughing episode which exacerbated her symptoms   PERTINENT HISTORY:  Per paper referral:  PAIN:  Are you having pain? Yes: NPRS scale: 6/10 Pain location: low back Pain description: ache Aggravating factors: prolonged tasks and positioning Relieving factors: rest and percocet  PRECAUTIONS: None  RED FLAGS: None   WEIGHT BEARING RESTRICTIONS: No  FALLS:  Has patient fallen in last 6 months? No  OCCUPATION: retired  PLOF: Independent  PATIENT GOALS: To manage my back symptoms  NEXT MD VISIT: after PT per pt report   OBJECTIVE:  Note: Objective measures were completed at Evaluation unless otherwise noted.  DIAGNOSTIC FINDINGS:  Disc spaces: Degenerative disease with disc height loss at T9-10, T10-11, T11-12, T12-L1, L1-2, L4-5 and L5-S1.   T9-10: Broad-based  disc bulge. Mild bilateral facet arthropathy. Moderate right and mild left foraminal stenosis. No spinal stenosis.   T10-11: Broad-based disc bulge flattening the ventral thecal sac. Bilateral moderate foraminal stenosis. No spinal stenosis.   T11-12: Mild broad-based disc bulge. No foraminal or central canal stenosis.   T12-L1: Mild broad-based disc bulge. Mild bilateral facet arthropathy. No foraminal or central canal stenosis.   L1-L2: Broad-based disc osteophyte complex flattening the ventral thecal sac. Mild bilateral facet arthropathy. Moderate spinal stenosis. Moderate left foraminal stenosis. Mild right foraminal stenosis.   L2-L3: Broad-based disc bulge flattening the ventral thecal sac. Moderate bilateral facet arthropathy. Mild spinal stenosis. Mild left foraminal stenosis. Moderate right foraminal stenosis.   L3-L4: Broad-based disc bulge. Moderate bilateral facet arthropathy. Severe spinal stenosis. Moderate bilateral foraminal stenosis.   L4-L5: Broad-based disc bulge with a right subarticular disc protrusion and mass effect on the right intraspinal L5 nerve root. Mild bilateral facet arthropathy. Mild right foraminal stenosis. No left foraminal stenosis. No spinal stenosis.   L5-S1: Broad-based disc bulge with a left subarticular disc protrusion contacting the left intraspinal S1 nerve root. Left laminotomy defect. Mild bilateral facet arthropathy. Moderate-severe left foraminal stenosis. No right foraminal stenosis.   IMPRESSION: 1. Diffuse lumbar spine spondylosis as described above. 2. No acute osseous injury of the lumbar spine.     Electronically Signed   By: Julaine Blanch M.D.   On: 06/29/2023 09:57  PATIENT SURVEYS:  FOTO 43( 52 predicted)   MUSCLE LENGTH: UTA  POSTURE:  flexed posture and lateral curvatures  PALPATION: Increased tension noted throughout lumbar paraspinals  LUMBAR ROM:   AROM eval  Flexion 25%  Extension 0%  Right  lateral flexion   Left lateral flexion   Right rotation   Left rotation    (Blank rows = not tested)  LOWER EXTREMITY ROM:   WFL for gait and transfers  Active  Right eval Left eval  Hip flexion    Hip extension    Hip abduction    Hip adduction    Hip internal rotation    Hip external rotation    Knee flexion    Knee extension    Ankle dorsiflexion    Ankle plantarflexion    Ankle inversion    Ankle eversion     (Blank rows = not tested)  LOWER EXTREMITY MMT:    MMT Right eval Left eval  Hip flexion    Hip extension    Hip abduction    Hip adduction    Hip internal rotation  Hip external rotation    Knee flexion    Knee extension    Ankle dorsiflexion    Ankle plantarflexion    Ankle inversion    Ankle eversion     (Blank rows = not tested)  LUMBAR SPECIAL TESTS:  Straight leg raise test: Negative and Slump test: Negative  FUNCTIONAL TESTS:  30 seconds chair stand test  GAIT: Distance walked: 53ft x2 Assistive device utilized: None Level of assistance: Complete Independence Comments: slow antalgic pattern  TODAY'S TREATMENT:         [facility] Adult PT Treatment:                                                DATE: 09/28/23 Therapeutic Exercise: Nustep L2 8 min Seated hamstring stretch 30s x2 B Introduced seated core exercises of hip tosses, chops 10 reps with 2000g weighted ball followed by latissimus pushdowns with inspiration on active press.  Advanced to alt FAQs and marching with pushdowns, 10/10.   10x STS                                                                                                                      [facility] Adult PT Treatment:                                                DATE: 09/20/23 Therapeutic Exercise: Seated hamstring stretch 2x30sec BIL  Sciatic nerve glide 2x8 BIL cues for form  Standing heel/toe raises x10 cues for posture Seated red band paloff press x10 BIL Seated adduction isometric x10, x15 cues for appropriate  force output, breath control  STS from chair 2x5 HEP update + education/handout, time spent w/ education re: rationale for interventions    PATIENT EDUCATION:  Education details: rationale for interventions, HEP  Person educated: Patient Education method: Explanation, Demonstration, Tactile cues, Verbal cues Education comprehension: verbalized understanding, returned demonstration, verbal cues required, tactile cues required, and needs further education     HOME EXERCISE PROGRAM: Access Code: [facility] URL: [email or web address] Date: 09/20/2023 Prepared by: Alm Jenny  Exercises - Heel Toe Raises with Unilateral Counter Support  - 2 x daily - 5 x weekly - 1 sets - 10 reps - Seated Table Hamstring Stretch  - 2 x daily - 5 x weekly - 1 sets - 2 reps - 30s hold - Seated Sciatic Tensioner  - 2 x daily - 5 x weekly - 1 sets - 10 reps - Sit to Stand with Armchair  - 2-3 x daily - 5 x weekly - 1 sets - 5 reps - Seated Hip Adduction Isometrics with Ball  - 2-3 x daily - 5 x weekly - 1 sets - 10 reps  ASSESSMENT:  CLINICAL  IMPRESSION: Underwent spinal injections with some relief reported as stir climbing less painful.  Focus of today was aerobic w/u prior to stretching and core strengthening.   Performed seated core tasks to minimize spinal stress and focus on isometric core and oblique strengthening.  Incorporated BLE alternating movements into core exercises.  Ended session reviewing STS w/o UE support, tactile cues from PT to reinforce proper form and body mechanics.    Per eval - Patient is a 68 y.o. female who was seen today for physical therapy evaluation and treatment for chronic low back pain. She is to undergo ESI in 1 week and has been referred to OPPT to fulfill requirements to undergo recommended lumbar fusion.  She is reluctant to participate in OPPT due to financial reasons as well as unsuccessful conservative care in the past.  Patient was unable to tolerate  various testing positions and objective data is sparse.  She was instructed in a HEP to address found deficits but has deferred to schedule f/u sessions until she obtains clarification regarding surgical prerequisites.  OBJECTIVE IMPAIRMENTS: Abnormal gait, decreased activity tolerance, decreased knowledge of condition, decreased mobility, difficulty walking, decreased ROM, decreased strength, postural dysfunction, and pain.   ACTIVITY LIMITATIONS: carrying, lifting, bending, sitting, standing, squatting, stairs, and locomotion level  PERSONAL FACTORS: Behavior pattern, Fitness, Past/current experiences, and Time since onset of injury/illness/exacerbation are also affecting patient's functional outcome.   REHAB POTENTIAL: Fair based on chronicity and advanced degenerative changes  CLINICAL DECISION MAKING: Evolving/moderate complexity  EVALUATION COMPLEXITY: Moderate   GOALS: Goals reviewed with patient? No  SHORT TERM GOALS=LONG TERM GOALS: Target date: 11/06/2023  Patient to demonstrate independence in HEP  Baseline: [id number] Goal status: Ongoing  2.  Patient will increase 30s chair stand reps from 1 to 2 with/without arms to demonstrate and improved functional ability with less pain/difficulty as well as reduce fall risk.  Baseline: 1 Goal status: INITIAL  3.  Patient will acknowledge 6/10 pain at least once during episode of care   Baseline: 10+/10 Goal status: INITIAL  4.  Increase AROM lumbar spine 50% flexion and 10% extension Baseline:  AROM eval  Flexion 25%  Extension 0%   Goal status: INITIAL  5.  Patient will score at least 52% on FOTO to signify clinically meaningful improvement in functional abilities.   Baseline: 43% Goal status: INITIAL      PLAN:  PT FREQUENCY: 1x/week  PT DURATION: 6 weeks  PLANNED INTERVENTIONS: 97164- PT Re-evaluation, 97110-Therapeutic exercises, 97530- Therapeutic activity, [id number]- Neuromuscular re-education, 97535- Self Care,  97140- Manual therapy, [id number]- Gait training, [id number]- Aquatic Therapy, Dry Needling, Joint mobilization, Spinal mobilization, Cryotherapy, and Moist heat.  PLAN FOR NEXT SESSION: HEP review and update, manual techniques as appropriate, aerobic tasks, ROM and flexibility activities, strengthening and PREs, TPDN, gait and balance training as needed    Jeff Landi Biscardi PT  09/28/2023 10:44 AM

## 2023-09-28 ENCOUNTER — Ambulatory Visit: Payer: Medicare Other | Attending: Neurological Surgery

## 2023-09-28 DIAGNOSIS — M5459 Other low back pain: Secondary | ICD-10-CM | POA: Diagnosis present

## 2023-09-28 DIAGNOSIS — M6281 Muscle weakness (generalized): Secondary | ICD-10-CM | POA: Insufficient documentation

## 2023-09-28 DIAGNOSIS — M4316 Spondylolisthesis, lumbar region: Secondary | ICD-10-CM | POA: Insufficient documentation

## 2023-10-01 NOTE — Therapy (Signed)
 OUTPATIENT PHYSICAL THERAPY TREATMENT   Patient Name: Brooke Mccarthy MRN: [id number] DOB:10-26-1955, 68 y.o., female Today's Date: 10/03/2023  END OF SESSION:  PT End of Session - 10/03/23 1219     Visit Number 4    Number of Visits 6    Date for PT Re-Evaluation 11/12/23    Authorization Type UHC MCR    Authorization Time Period auth tbd    PT Start Time 1215    PT Stop Time 1255    PT Time Calculation (min) 40 min    Activity Tolerance Patient tolerated treatment well;Patient limited by pain    Behavior During Therapy WFL for tasks assessed/performed                Past Medical History:  Diagnosis Date   Anxiety    Arthritis    Chronic back pain    FH: restless leg syndrome    GERD (gastroesophageal reflux disease)    History of gallstones    Hyperlipidemia    Hypertension    Hypothyroidism    PONV (postoperative nausea and vomiting)    Past Surgical History:  Procedure Laterality Date   BACK SURGERY     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     BILATERAL FOOT SURGERY     BREAST BIOPSY Left 10/30/2022   MM LT BREAST BX W LOC DEV 1ST LESION IMAGE BX SPEC STEREO GUIDE 10/30/2022 [facility]   BREAST BIOPSY Right 10/30/2022   MM RT BREAST BX W LOC DEV 1ST LESION IMAGE BX SPEC STEREO GUIDE 10/30/2022 [facility]   BREAST EXCISIONAL BIOPSY Right    late 80's   BREAST EXCISIONAL BIOPSY Right    CHOLECYSTECTOMY N/A 09/11/2017   Procedure: LAPAROSCOPIC CHOLECYSTECTOMY;  Surgeon: Kimble Agent, MD;  Location: [facility] OR;  Service: General;  Laterality: N/A;   ERCP N/A 09/10/2017   Procedure: ENDOSCOPIC RETROGRADE CHOLANGIOPANCREATOGRAPHY (ERCP);  Surgeon: Avram Lupita BRAVO, MD;  Location: [facility] ENDOSCOPY;  Service: Endoscopy;  Laterality: N/A;   FEMUR IM NAIL Left 10/10/2013   Procedure: INTRAMEDULLARY (IM) RETROGRADE FEMORAL NAILING;  Surgeon: Norleen Armor, MD;  Location: [facility] OR;  Service: Orthopedics;  Laterality: Left;   TUBAL LIGATION     Patient Active Problem  List   Diagnosis Date Noted   Cholelithiasis with choledocholithiasis    Abnormal LFTs (liver function tests)    Biliary obstruction    Choledocholithiasis    Common bile duct dilation 09/08/2017   Hypokalemia 09/08/2017   Hyponatremia 09/08/2017   Leukocytosis 09/08/2017   Abdominal pain    Displaced spiral fracture of shaft of femur (HCC) 10/10/2013   Femur fracture, left (HCC) 10/10/2013    PCP: Maree Leni Edyth DELENA, MD   REFERRING PROVIDER: Colon Shove, MD  REFERRING DIAG: M43.16 (ICD-10-CM) - Spondylolisthesis, lumbar region  Rationale for Evaluation and Treatment: Rehabilitation  THERAPY DIAG:  Other low back pain  Muscle weakness (generalized)  Spondylolisthesis of lumbar region  ONSET DATE: chronic  SUBJECTIVE:  Per eval - Describes a history of chronic low back pain.  Has had previous surgeries 25 yrs ago and in 2015  SUBJECTIVE STATEMENT: Reported 1 day of soreness following last session.   PERTINENT HISTORY:  Per paper referral:  PAIN:  Are you having pain? Yes: NPRS scale: 6/10 Pain location: low back Pain description: ache Aggravating factors: prolonged tasks and positioning Relieving factors: rest and percocet  PRECAUTIONS: None  RED FLAGS: None   WEIGHT BEARING RESTRICTIONS: No  FALLS:  Has patient fallen in last 6 months? No  OCCUPATION: retired  PLOF: Independent  PATIENT GOALS: To manage my back symptoms  NEXT MD VISIT: after PT per pt report   OBJECTIVE:  Note: Objective measures were completed at Evaluation unless otherwise noted.  DIAGNOSTIC FINDINGS:  Disc spaces: Degenerative disease with disc height loss at T9-10, T10-11, T11-12, T12-L1, L1-2, L4-5 and L5-S1.   T9-10: Broad-based disc bulge. Mild bilateral facet arthropathy. Moderate right  and mild left foraminal stenosis. No spinal stenosis.   T10-11: Broad-based disc bulge flattening the ventral thecal sac. Bilateral moderate foraminal stenosis. No spinal stenosis.   T11-12: Mild broad-based disc bulge. No foraminal or central canal stenosis.   T12-L1: Mild broad-based disc bulge. Mild bilateral facet arthropathy. No foraminal or central canal stenosis.   L1-L2: Broad-based disc osteophyte complex flattening the ventral thecal sac. Mild bilateral facet arthropathy. Moderate spinal stenosis. Moderate left foraminal stenosis. Mild right foraminal stenosis.   L2-L3: Broad-based disc bulge flattening the ventral thecal sac. Moderate bilateral facet arthropathy. Mild spinal stenosis. Mild left foraminal stenosis. Moderate right foraminal stenosis.   L3-L4: Broad-based disc bulge. Moderate bilateral facet arthropathy. Severe spinal stenosis. Moderate bilateral foraminal stenosis.   L4-L5: Broad-based disc bulge with a right subarticular disc protrusion and mass effect on the right intraspinal L5 nerve root. Mild bilateral facet arthropathy. Mild right foraminal stenosis. No left foraminal stenosis. No spinal stenosis.   L5-S1: Broad-based disc bulge with a left subarticular disc protrusion contacting the left intraspinal S1 nerve root. Left laminotomy defect. Mild bilateral facet arthropathy. Moderate-severe left foraminal stenosis. No right foraminal stenosis.   IMPRESSION: 1. Diffuse lumbar spine spondylosis as described above. 2. No acute osseous injury of the lumbar spine.     Electronically Signed   By: Julaine Blanch M.D.   On: 06/29/2023 09:57  PATIENT SURVEYS:  FOTO 43( 52 predicted)   MUSCLE LENGTH: UTA  POSTURE:  flexed posture and lateral curvatures  PALPATION: Increased tension noted throughout lumbar paraspinals  LUMBAR ROM:   AROM eval  Flexion 25%  Extension 0%  Right lateral flexion   Left lateral flexion   Right rotation   Left  rotation    (Blank rows = not tested)  LOWER EXTREMITY ROM:   WFL for gait and transfers  Active  Right eval Left eval  Hip flexion    Hip extension    Hip abduction    Hip adduction    Hip internal rotation    Hip external rotation    Knee flexion    Knee extension    Ankle dorsiflexion    Ankle plantarflexion    Ankle inversion    Ankle eversion     (Blank rows = not tested)  LOWER EXTREMITY MMT:    MMT Right eval Left eval  Hip flexion    Hip extension    Hip abduction    Hip adduction    Hip internal rotation    Hip external rotation  Knee flexion    Knee extension    Ankle dorsiflexion    Ankle plantarflexion    Ankle inversion    Ankle eversion     (Blank rows = not tested)  LUMBAR SPECIAL TESTS:  Straight leg raise test: Negative and Slump test: Negative  FUNCTIONAL TESTS:  30 seconds chair stand test  GAIT: Distance walked: 64ft x2 Assistive device utilized: None Level of assistance: Complete Independence Comments: slow antalgic pattern  TODAY'S TREATMENT:       [facility] Adult PT Treatment:                                                DATE: 10/03/23 Therapeutic Exercise: Nustep L3 8 min Seated hamstring stretch 30s x2 B Open book 10/10 with breathing patterns S/L clams 15/15 Supine march 10/10 Supine p-ball curl ups 10x B, 10/10 unilaterally Supine OH flexion with p-ball 10x Supine hip fallouts GTB 10x B 10/10 unilaterally Alt FAQs and marching with latissimus pushdowns, 10/10   [facility] Adult PT Treatment:                                                DATE: 09/28/23 Therapeutic Exercise: Nustep L2 8 min Seated hamstring stretch 30s x2 B Introduced seated core exercises of hip tosses, chops 10 reps with 2000g weighted ball followed by latissimus pushdowns with inspiration on active press.  Advanced to alt FAQs and marching with pushdowns, 10/10.   10x STS                                                                                                                       [facility] Adult PT Treatment:                                                DATE: 09/20/23 Therapeutic Exercise: Seated hamstring stretch 2x30sec BIL  Sciatic nerve glide 2x8 BIL cues for form  Standing heel/toe raises x10 cues for posture Seated red band paloff press x10 BIL Seated adduction isometric x10, x15 cues for appropriate force output, breath control  STS from chair 2x5 HEP update + education/handout, time spent w/ education re: rationale for interventions    PATIENT EDUCATION:  Education details: rationale for interventions, HEP  Person educated: Patient Education method: Explanation, Demonstration, Tactile cues, Verbal cues Education comprehension: verbalized understanding, returned demonstration, verbal cues required, tactile cues required, and needs further education     HOME EXERCISE PROGRAM: Access Code: [facility] URL: [email or web address] Date: 09/20/2023 Prepared by: Alm Jenny  Exercises - Heel Toe Raises with Unilateral Counter Support  - 2  x daily - 5 x weekly - 1 sets - 10 reps - Seated Table Hamstring Stretch  - 2 x daily - 5 x weekly - 1 sets - 2 reps - 30s hold - Seated Sciatic Tensioner  - 2 x daily - 5 x weekly - 1 sets - 10 reps - Sit to Stand with Armchair  - 2-3 x daily - 5 x weekly - 1 sets - 5 reps - Seated Hip Adduction Isometrics with Ball  - 2-3 x daily - 5 x weekly - 1 sets - 10 reps  ASSESSMENT:  CLINICAL IMPRESSION: Continued to emphasize core strengthening and added additional flexibility and stretching tasks.  Incorporated supine and sidelie positions into exercise program and patient able to tolerate positions w/o symptom increase.  Despite continued symptom intensity, patient able to perform more functional tasks w/o increased symptoms including stair negotiation.  Per eval - Patient is a 68 y.o. female who was seen today for physical therapy evaluation and treatment for chronic low back pain. She is  to undergo ESI in 1 week and has been referred to OPPT to fulfill requirements to undergo recommended lumbar fusion.  She is reluctant to participate in OPPT due to financial reasons as well as unsuccessful conservative care in the past.  Patient was unable to tolerate various testing positions and objective data is sparse.  She was instructed in a HEP to address found deficits but has deferred to schedule f/u sessions until she obtains clarification regarding surgical prerequisites.  OBJECTIVE IMPAIRMENTS: Abnormal gait, decreased activity tolerance, decreased knowledge of condition, decreased mobility, difficulty walking, decreased ROM, decreased strength, postural dysfunction, and pain.   ACTIVITY LIMITATIONS: carrying, lifting, bending, sitting, standing, squatting, stairs, and locomotion level  PERSONAL FACTORS: Behavior pattern, Fitness, Past/current experiences, and Time since onset of injury/illness/exacerbation are also affecting patient's functional outcome.   REHAB POTENTIAL: Fair based on chronicity and advanced degenerative changes  CLINICAL DECISION MAKING: Evolving/moderate complexity  EVALUATION COMPLEXITY: Moderate   GOALS: Goals reviewed with patient? No  SHORT TERM GOALS=LONG TERM GOALS: Target date: 11/06/2023  Patient to demonstrate independence in HEP  Baseline: [id number] Goal status: Ongoing  2.  Patient will increase 30s chair stand reps from 1 to 2 with/without arms to demonstrate and improved functional ability with less pain/difficulty as well as reduce fall risk.  Baseline: 1 Goal status: INITIAL  3.  Patient will acknowledge 6/10 pain at least once during episode of care   Baseline: 10+/10 Goal status: INITIAL  4.  Increase AROM lumbar spine 50% flexion and 10% extension Baseline:  AROM eval  Flexion 25%  Extension 0%   Goal status: INITIAL  5.  Patient will score at least 52% on FOTO to signify clinically meaningful improvement in functional  abilities.   Baseline: 43% Goal status: INITIAL      PLAN:  PT FREQUENCY: 1x/week  PT DURATION: 6 weeks  PLANNED INTERVENTIONS: 97164- PT Re-evaluation, 97110-Therapeutic exercises, 97530- Therapeutic activity, [id number]- Neuromuscular re-education, 97535- Self Care, [id number]- Manual therapy, [id number]- Gait training, [phone]- Aquatic Therapy, Dry Needling, Joint mobilization, Spinal mobilization, Cryotherapy, and Moist heat.  PLAN FOR NEXT SESSION: HEP review and update, manual techniques as appropriate, aerobic tasks, ROM and flexibility activities, strengthening and PREs, TPDN, gait and balance training as needed    Jeff Damonie Ellenwood PT  10/03/2023 1:08 PM

## 2023-10-03 ENCOUNTER — Ambulatory Visit: Payer: Medicare Other

## 2023-10-03 DIAGNOSIS — M4316 Spondylolisthesis, lumbar region: Secondary | ICD-10-CM

## 2023-10-03 DIAGNOSIS — M5459 Other low back pain: Secondary | ICD-10-CM

## 2023-10-03 DIAGNOSIS — M6281 Muscle weakness (generalized): Secondary | ICD-10-CM

## 2023-10-08 NOTE — Therapy (Signed)
 OUTPATIENT PHYSICAL THERAPY TREATMENT   Patient Name: Brooke Mccarthy MRN: [id number] DOB:08/17/1956, 68 y.o., female Today's Date: 10/10/2023  END OF SESSION:  PT End of Session - 10/10/23 1217     Visit Number 5    Number of Visits 6    Date for PT Re-Evaluation 11/12/23    Authorization Type [employer] MCR    Authorization Time Period auth tbd    PT Start Time 1215    PT Stop Time 1255    PT Time Calculation (min) 40 min    Activity Tolerance Patient tolerated treatment well;Patient limited by pain    Behavior During Therapy WFL for tasks assessed/performed                 Past Medical History:  Diagnosis Date   Anxiety    Arthritis    Chronic back pain    FH: restless leg syndrome    GERD (gastroesophageal reflux disease)    History of gallstones    Hyperlipidemia    Hypertension    Hypothyroidism    PONV (postoperative nausea and vomiting)    Past Surgical History:  Procedure Laterality Date   BACK SURGERY     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     BILATERAL FOOT SURGERY     BREAST BIOPSY Left 10/30/2022   MM LT BREAST BX W LOC DEV 1ST LESION IMAGE BX SPEC STEREO GUIDE 10/30/2022 [facility]   BREAST BIOPSY Right 10/30/2022   MM RT BREAST BX W LOC DEV 1ST LESION IMAGE BX SPEC STEREO GUIDE 10/30/2022 [facility]   BREAST EXCISIONAL BIOPSY Right    late 80's   BREAST EXCISIONAL BIOPSY Right    CHOLECYSTECTOMY N/A 09/11/2017   Procedure: LAPAROSCOPIC CHOLECYSTECTOMY;  Surgeon: Jerryl Morin, MD;  Location: [facility] OR;  Service: General;  Laterality: N/A;   ERCP N/A 09/10/2017   Procedure: ENDOSCOPIC RETROGRADE CHOLANGIOPANCREATOGRAPHY (ERCP);  Surgeon: Kenney Peacemaker, MD;  Location: [facility] ENDOSCOPY;  Service: Endoscopy;  Laterality: N/A;   FEMUR IM NAIL Left 10/10/2013   Procedure: INTRAMEDULLARY (IM) RETROGRADE FEMORAL NAILING;  Surgeon: Amada Backer, MD;  Location: [facility] OR;  Service: Orthopedics;  Laterality: Left;   TUBAL LIGATION     Patient Active  Problem List   Diagnosis Date Noted   Cholelithiasis with choledocholithiasis    Abnormal LFTs (liver function tests)    Biliary obstruction    Choledocholithiasis    Common bile duct dilation 09/08/2017   Hypokalemia 09/08/2017   Hyponatremia 09/08/2017   Leukocytosis 09/08/2017   Abdominal pain    Displaced spiral fracture of shaft of femur (HCC) 10/10/2013   Femur fracture, left (HCC) 10/10/2013    PCP: Ulysees Gander, MD   REFERRING PROVIDER: Elna Haggis, MD  REFERRING DIAG: M43.16 (ICD-10-CM) - Spondylolisthesis, lumbar region  Rationale for Evaluation and Treatment: Rehabilitation  THERAPY DIAG:  Other low back pain  Muscle weakness (generalized)  Spondylolisthesis of lumbar region  ONSET DATE: chronic  SUBJECTIVE:  Per eval - Describes a history of chronic low back pain.  Has had previous surgeries 25 yrs ago and in 2015  SUBJECTIVE STATEMENT: Only mild post therapy soreness reported   PERTINENT HISTORY:  Per paper referral:  PAIN:  Are you having pain? Yes: NPRS scale: 6/10 Pain location: low back Pain description: ache Aggravating factors: prolonged tasks and positioning Relieving factors: rest and percocet  PRECAUTIONS: None  RED FLAGS: None   WEIGHT BEARING RESTRICTIONS: No  FALLS:  Has patient fallen in last 6 months? No  OCCUPATION: retired  PLOF: Independent  PATIENT GOALS: To manage my back symptoms  NEXT MD VISIT: 10/31/23  OBJECTIVE:  Note: Objective measures were completed at Evaluation unless otherwise noted.  DIAGNOSTIC FINDINGS:  Disc spaces: Degenerative disease with disc height loss at T9-10, T10-11, T11-12, T12-L1, L1-2, L4-5 and L5-S1.   T9-10: Broad-based disc bulge. Mild bilateral facet arthropathy. Moderate right and mild left  foraminal stenosis. No spinal stenosis.   T10-11: Broad-based disc bulge flattening the ventral thecal sac. Bilateral moderate foraminal stenosis. No spinal stenosis.   T11-12: Mild broad-based disc bulge. No foraminal or central canal stenosis.   T12-L1: Mild broad-based disc bulge. Mild bilateral facet arthropathy. No foraminal or central canal stenosis.   L1-L2: Broad-based disc osteophyte complex flattening the ventral thecal sac. Mild bilateral facet arthropathy. Moderate spinal stenosis. Moderate left foraminal stenosis. Mild right foraminal stenosis.   L2-L3: Broad-based disc bulge flattening the ventral thecal sac. Moderate bilateral facet arthropathy. Mild spinal stenosis. Mild left foraminal stenosis. Moderate right foraminal stenosis.   L3-L4: Broad-based disc bulge. Moderate bilateral facet arthropathy. Severe spinal stenosis. Moderate bilateral foraminal stenosis.   L4-L5: Broad-based disc bulge with a right subarticular disc protrusion and mass effect on the right intraspinal L5 nerve root. Mild bilateral facet arthropathy. Mild right foraminal stenosis. No left foraminal stenosis. No spinal stenosis.   L5-S1: Broad-based disc bulge with a left subarticular disc protrusion contacting the left intraspinal S1 nerve root. Left laminotomy defect. Mild bilateral facet arthropathy. Moderate-severe left foraminal stenosis. No right foraminal stenosis.   IMPRESSION: 1. Diffuse lumbar spine spondylosis as described above. 2. No acute osseous injury of the lumbar spine.     Electronically Signed   By: Onnie Bilis M.D.   On: 06/29/2023 09:57  PATIENT SURVEYS:  FOTO 43( 52 predicted)   MUSCLE LENGTH: UTA  POSTURE:  flexed posture and lateral curvatures  PALPATION: Increased tension noted throughout lumbar paraspinals  LUMBAR ROM:   AROM eval  Flexion 25%  Extension 0%  Right lateral flexion   Left lateral flexion   Right rotation   Left rotation     (Blank rows = not tested)  LOWER EXTREMITY ROM:   WFL for gait and transfers  Active  Right eval Left eval  Hip flexion    Hip extension    Hip abduction    Hip adduction    Hip internal rotation    Hip external rotation    Knee flexion    Knee extension    Ankle dorsiflexion    Ankle plantarflexion    Ankle inversion    Ankle eversion     (Blank rows = not tested)  LOWER EXTREMITY MMT:    MMT Right eval Left eval  Hip flexion    Hip extension    Hip abduction    Hip adduction    Hip internal rotation    Hip external rotation    Knee flexion    Knee extension  Ankle dorsiflexion    Ankle plantarflexion    Ankle inversion    Ankle eversion     (Blank rows = not tested)  LUMBAR SPECIAL TESTS:  Straight leg raise test: Negative and Slump test: Negative  FUNCTIONAL TESTS:  30 seconds chair stand test  GAIT: Distance walked: 22ft x2 Assistive device utilized: None Level of assistance: Complete Independence Comments: slow antalgic pattern  TODAY'S TREATMENT:       [facility] Adult PT Treatment:                                                DATE: 10/10/23 Therapeutic Exercise: Nustep L4 8 min Seated hamstring stretch 30s x2 B Open book 10/10  S/L clams 12/12 GTB Bridge against GTB 12x PPT 3s hold 10x Supine march 10/10 w/PPT Supine p-ball curl ups 12x B, 12/12 unilaterally Supine hip fallouts GTB 12x B 12/12 unilaterally  [facility] Adult PT Treatment:                                                DATE: 10/03/23 Therapeutic Exercise: Nustep L3 8 min Seated hamstring stretch 30s x2 B Open book 10/10 with breathing patterns S/L clams 15/15 Supine march 10/10 Supine p-ball curl ups 10x B, 10/10 unilaterally Supine OH flexion with p-ball 10x Supine hip fallouts GTB 10x B 10/10 unilaterally Alt FAQs and marching with latissimus pushdowns, 10/10   [facility] Adult PT Treatment:                                                DATE: 09/28/23 Therapeutic Exercise: Nustep L2  8 min Seated hamstring stretch 30s x2 B Introduced seated core exercises of hip tosses, chops 10 reps with 2000g weighted ball followed by latissimus pushdowns with inspiration on active press.  Advanced to alt FAQs and marching with pushdowns, 10/10.   10x STS                                                                                                                      [facility] Adult PT Treatment:                                                DATE: 09/20/23 Therapeutic Exercise: Seated hamstring stretch 2x30sec BIL  Sciatic nerve glide 2x8 BIL cues for form  Standing heel/toe raises x10 cues for posture Seated red band paloff press x10 BIL Seated adduction isometric x10, x15 cues for appropriate  force output, breath control  STS from chair 2x5 HEP update + education/handout, time spent w/ education re: rationale for interventions    PATIENT EDUCATION:  Education details: rationale for interventions, HEP  Person educated: Patient Education method: Explanation, Demonstration, Tactile cues, Verbal cues Education comprehension: verbalized understanding, returned demonstration, verbal cues required, tactile cues required, and needs further education     HOME EXERCISE PROGRAM: Access Code: [facility] URL: [email or web address] Date: 09/20/2023 Prepared by: Mayme Spearman  Exercises - Heel Toe Raises with Unilateral Counter Support  - 2 x daily - 5 x weekly - 1 sets - 10 reps - Seated Table Hamstring Stretch  - 2 x daily - 5 x weekly - 1 sets - 2 reps - 30s hold - Seated Sciatic Tensioner  - 2 x daily - 5 x weekly - 1 sets - 10 reps - Sit to Stand with Armchair  - 2-3 x daily - 5 x weekly - 1 sets - 5 reps - Seated Hip Adduction Isometrics with Ball  - 2-3 x daily - 5 x weekly - 1 sets - 10 reps  ASSESSMENT:  CLINICAL IMPRESSION: Added additional tasks with resistance, increasing reps as noted.  Able to tolerate all tasks w/o symptoms flare-up.  Per eval - Patient is a  68 y.o. female who was seen today for physical therapy evaluation and treatment for chronic low back pain. She is to undergo ESI in 1 week and has been referred to OPPT to fulfill requirements to undergo recommended lumbar fusion.  She is reluctant to participate in OPPT due to financial reasons as well as unsuccessful conservative care in the past.  Patient was unable to tolerate various testing positions and objective data is sparse.  She was instructed in a HEP to address found deficits but has deferred to schedule f/u sessions until she obtains clarification regarding surgical prerequisites.  OBJECTIVE IMPAIRMENTS: Abnormal gait, decreased activity tolerance, decreased knowledge of condition, decreased mobility, difficulty walking, decreased ROM, decreased strength, postural dysfunction, and pain.   ACTIVITY LIMITATIONS: carrying, lifting, bending, sitting, standing, squatting, stairs, and locomotion level  PERSONAL FACTORS: Behavior pattern, Fitness, Past/current experiences, and Time since onset of injury/illness/exacerbation are also affecting patient's functional outcome.   REHAB POTENTIAL: Fair based on chronicity and advanced degenerative changes  CLINICAL DECISION MAKING: Evolving/moderate complexity  EVALUATION COMPLEXITY: Moderate   GOALS: Goals reviewed with patient? No  SHORT TERM GOALS=LONG TERM GOALS: Target date: 11/06/2023  Patient to demonstrate independence in HEP  Baseline: [id number] Goal status: Ongoing  2.  Patient will increase 30s chair stand reps from 1 to 2 with/without arms to demonstrate and improved functional ability with less pain/difficulty as well as reduce fall risk.  Baseline: 1 Goal status: INITIAL  3.  Patient will acknowledge 6/10 pain at least once during episode of care   Baseline: 10+/10 Goal status: INITIAL  4.  Increase AROM lumbar spine 50% flexion and 10% extension Baseline:  AROM eval  Flexion 25%  Extension 0%   Goal status:  INITIAL  5.  Patient will score at least 52% on FOTO to signify clinically meaningful improvement in functional abilities.   Baseline: 43% Goal status: INITIAL      PLAN:  PT FREQUENCY: 1x/week  PT DURATION: 6 weeks  PLANNED INTERVENTIONS: 97164- PT Re-evaluation, 97110-Therapeutic exercises, 97530- Therapeutic activity, [id number]- Neuromuscular re-education, 97535- Self Care, 16109- Manual therapy, [id number]- Gait training, [phone]- Aquatic Therapy, Dry Needling, Joint mobilization, Spinal mobilization, Cryotherapy, and Moist heat.  PLAN FOR NEXT SESSION: HEP  review and update, manual techniques as appropriate, aerobic tasks, ROM and flexibility activities, strengthening and PREs, TPDN, gait and balance training as needed    Jeff Milbern Doescher PT  10/10/2023 1:00 PM

## 2023-10-10 ENCOUNTER — Ambulatory Visit: Payer: Medicare Other

## 2023-10-10 DIAGNOSIS — M5459 Other low back pain: Secondary | ICD-10-CM

## 2023-10-10 DIAGNOSIS — M4316 Spondylolisthesis, lumbar region: Secondary | ICD-10-CM

## 2023-10-10 DIAGNOSIS — M6281 Muscle weakness (generalized): Secondary | ICD-10-CM

## 2023-10-17 ENCOUNTER — Ambulatory Visit: Payer: Medicare Other

## 2023-10-17 DIAGNOSIS — M6281 Muscle weakness (generalized): Secondary | ICD-10-CM

## 2023-10-17 DIAGNOSIS — M4316 Spondylolisthesis, lumbar region: Secondary | ICD-10-CM

## 2023-10-17 DIAGNOSIS — M5459 Other low back pain: Secondary | ICD-10-CM | POA: Diagnosis not present

## 2023-10-17 NOTE — Therapy (Signed)
OUTPATIENT PHYSICAL THERAPY TREATMENT/DISCHARGE   Patient Name: Brooke Mccarthy MRN: [id number] DOB:August 22, 1956, 68 y.o., female Today's Date: 10/17/2023 PHYSICAL THERAPY DISCHARGE SUMMARY  Visits from Start of Care: 6  Current functional level related to goals / functional outcomes: Goals met   Remaining deficits: Pain    Education / Equipment: HEP   Patient agrees to discharge. Patient goals were met. Patient is being discharged due to being pleased with the current functional level.  END OF SESSION:  PT End of Session - 10/17/23 1219     Visit Number 6    Number of Visits 6    Date for PT Re-Evaluation 11/12/23    Authorization Type [employer] MCR    Authorization Time Period auth tbd    PT Start Time 1215    PT Stop Time 1255    PT Time Calculation (min) 40 min    Activity Tolerance Patient tolerated treatment well;Patient limited by pain    Behavior During Therapy WFL for tasks assessed/performed                  Past Medical History:  Diagnosis Date   Anxiety    Arthritis    Chronic back pain    FH: restless leg syndrome    GERD (gastroesophageal reflux disease)    History of gallstones    Hyperlipidemia    Hypertension    Hypothyroidism    PONV (postoperative nausea and vomiting)    Past Surgical History:  Procedure Laterality Date   BACK SURGERY     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     BILATERAL FOOT SURGERY     BREAST BIOPSY Left 10/30/2022   MM LT BREAST BX W LOC DEV 1ST LESION IMAGE BX SPEC STEREO GUIDE 10/30/2022 [facility]   BREAST BIOPSY Right 10/30/2022   MM RT BREAST BX W LOC DEV 1ST LESION IMAGE BX SPEC STEREO GUIDE 10/30/2022 [facility]   BREAST EXCISIONAL BIOPSY Right    late 80's   BREAST EXCISIONAL BIOPSY Right    CHOLECYSTECTOMY N/A 09/11/2017   Procedure: LAPAROSCOPIC CHOLECYSTECTOMY;  Surgeon: Jimmye Norman, MD;  Location: [facility] OR;  Service: General;  Laterality: N/A;   ERCP N/A 09/10/2017   Procedure: ENDOSCOPIC  RETROGRADE CHOLANGIOPANCREATOGRAPHY (ERCP);  Surgeon: Iva Boop, MD;  Location[facility] ENDOSCOPY;  Service: Endoscopy;  Laterality: N/A;   FEMUR IM NAIL Left 10/10/2013   Procedure: INTRAMEDULLARY (IM) RETROGRADE FEMORAL NAILING;  Surgeon: Toni Arthurs, MD;  Location: [facility] OR;  Service: Orthopedics;  Laterality: Left;   TUBAL LIGATION     Patient Active Problem List   Diagnosis Date Noted   Cholelithiasis with choledocholithiasis    Abnormal LFTs (liver function tests)    Biliary obstruction    Choledocholithiasis    Common bile duct dilation 09/08/2017   Hypokalemia 09/08/2017   Hyponatremia 09/08/2017   Leukocytosis 09/08/2017   Abdominal pain    Displaced spiral fracture of shaft of femur (HCC) 10/10/2013   Femur fracture, left (HCC) 10/10/2013    PCP: Ellyn Hack, MD   REFERRING PROVIDER: Barnett Abu, MD  REFERRING DIAG: M43.16 (ICD-10-CM) - Spondylolisthesis, lumbar region  Rationale for Evaluation and Treatment: Rehabilitation  THERAPY DIAG:  Other low back pain  Muscle weakness (generalized)  Spondylolisthesis of lumbar region  ONSET DATE: chronic  SUBJECTIVE:  Per eval - Describes a history of chronic low back pain.  Has had previous surgeries 25 yrs ago and in 2015  SUBJECTIVE STATEMENT: Symptoms range from 2-8/10 depending on activity level.   PERTINENT HISTORY:  Per paper referral:  PAIN:  Are you having pain? Yes: NPRS scale: 2-8/10 Pain location: low back Pain description: ache Aggravating factors: prolonged tasks and positioning Relieving factors: rest and percocet  PRECAUTIONS: None  RED FLAGS: None   WEIGHT BEARING RESTRICTIONS: No  FALLS:  Has patient fallen in last 6 months? No  OCCUPATION: retired  PLOF: Independent  PATIENT GOALS: To manage my  back symptoms  NEXT MD VISIT: 10/31/23  OBJECTIVE:  Note: Objective measures were completed at Evaluation unless otherwise noted.  DIAGNOSTIC FINDINGS:  Disc spaces: Degenerative disease with disc height loss at T9-10, T10-11, T11-12, T12-L1, L1-2, L4-5 and L5-S1.   T9-10: Broad-based disc bulge. Mild bilateral facet arthropathy. Moderate right and mild left foraminal stenosis. No spinal stenosis.   T10-11: Broad-based disc bulge flattening the ventral thecal sac. Bilateral moderate foraminal stenosis. No spinal stenosis.   T11-12: Mild broad-based disc bulge. No foraminal or central canal stenosis.   T12-L1: Mild broad-based disc bulge. Mild bilateral facet arthropathy. No foraminal or central canal stenosis.   L1-L2: Broad-based disc osteophyte complex flattening the ventral thecal sac. Mild bilateral facet arthropathy. Moderate spinal stenosis. Moderate left foraminal stenosis. Mild right foraminal stenosis.   L2-L3: Broad-based disc bulge flattening the ventral thecal sac. Moderate bilateral facet arthropathy. Mild spinal stenosis. Mild left foraminal stenosis. Moderate right foraminal stenosis.   L3-L4: Broad-based disc bulge. Moderate bilateral facet arthropathy. Severe spinal stenosis. Moderate bilateral foraminal stenosis.   L4-L5: Broad-based disc bulge with a right subarticular disc protrusion and mass effect on the right intraspinal L5 nerve root. Mild bilateral facet arthropathy. Mild right foraminal stenosis. No left foraminal stenosis. No spinal stenosis.   L5-S1: Broad-based disc bulge with a left subarticular disc protrusion contacting the left intraspinal S1 nerve root. Left laminotomy defect. Mild bilateral facet arthropathy. Moderate-severe left foraminal stenosis. No right foraminal stenosis.   IMPRESSION: 1. Diffuse lumbar spine spondylosis as described above. 2. No acute osseous injury of the lumbar spine.     Electronically Signed   By: Elige Ko M.D.   On: 06/29/2023 09:57  PATIENT SURVEYS:  FOTO 43( 52 predicted) 10/17/23 56   MUSCLE LENGTH: UTA  POSTURE:  flexed posture and lateral curvatures  PALPATION: Increased tension noted throughout lumbar paraspinals  LUMBAR ROM:   AROM eval 10/17/23  Flexion 25% 90%  Extension 0% 75%  Right lateral flexion    Left lateral flexion    Right rotation    Left rotation     (Blank rows = not tested)  LOWER EXTREMITY ROM:   WFL for gait and transfers  Active  Right eval Left eval  Hip flexion    Hip extension    Hip abduction    Hip adduction    Hip internal rotation    Hip external rotation    Knee flexion    Knee extension    Ankle dorsiflexion    Ankle plantarflexion    Ankle inversion    Ankle eversion     (Blank rows = not tested)  LOWER EXTREMITY MMT:    MMT Right eval Left eval  Hip flexion    Hip extension    Hip abduction    Hip adduction    Hip internal rotation    Hip external  rotation    Knee flexion    Knee extension    Ankle dorsiflexion    Ankle plantarflexion    Ankle inversion    Ankle eversion     (Blank rows = not tested)  LUMBAR SPECIAL TESTS:  Straight leg raise test: Negative and Slump test: Negative  FUNCTIONAL TESTS:  30 seconds chair stand test  GAIT: Distance walked: 67ft x2 Assistive device utilized: None Level of assistance: Complete Independence Comments: slow antalgic pattern  TODAY'S TREATMENT:       [facility] Adult PT Treatment:                                                DATE: 10/17/23 Therapeutic Exercise: Nustep L4 8 min Seated hamstring stretch 30s B Curl ups L2 10x Open book 10/10  Bridge with ball 10x Supine p-ball curl ups 10x B, 10/10 unilaterally Supine hip fallouts GTB 12x B 12/12 unilaterally    [facility] Adult PT Treatment:                                                DATE: 10/10/23 Therapeutic Exercise: Nustep L4 8 min Seated hamstring stretch 30s x2 B Open book 10/10  S/L clams 12/12  GTB Bridge against GTB 12x PPT 3s hold 10x Supine march 10/10 w/PPT Supine p-ball curl ups 12x B, 12/12 unilaterally Supine hip fallouts GTB 12x B 12/12 unilaterally  [facility] Adult PT Treatment:                                                DATE: 10/03/23 Therapeutic Exercise: Nustep L3 8 min Seated hamstring stretch 30s x2 B Open book 10/10 with breathing patterns S/L clams 15/15 Supine march 10/10 Supine p-ball curl ups 10x B, 10/10 unilaterally Supine OH flexion with p-ball 10x Supine hip fallouts GTB 10x B 10/10 unilaterally Alt FAQs and marching with latissimus pushdowns, 10/10   [facility] Adult PT Treatment:                                                DATE: 09/28/23 Therapeutic Exercise: Nustep L2 8 min Seated hamstring stretch 30s x2 B Introduced seated core exercises of hip tosses, chops 10 reps with 2000g weighted ball followed by latissimus pushdowns with inspiration on active press.  Advanced to alt FAQs and marching with pushdowns, 10/10.   10x STS  [facility] Adult PT Treatment:                                                DATE: 09/20/23 Therapeutic Exercise: Seated hamstring stretch 2x30sec BIL  Sciatic nerve glide 2x8 BIL cues for form  Standing heel/toe raises x10 cues for posture Seated red band paloff press x10 BIL Seated adduction isometric x10, x15 cues for appropriate force output, breath control  STS from chair 2x5 HEP update + education/handout, time spent w/ education re: rationale for interventions    PATIENT EDUCATION:  Education details: rationale for interventions, HEP  Person educated: Patient Education method: Explanation, Demonstration, Tactile cues, Verbal cues Education comprehension: verbalized understanding, returned demonstration, verbal cues required, tactile cues required, and needs further education     HOME EXERCISE  PROGRAM: Access Code: [facility] URL: [email or web address] Date: 10/17/2023 Prepared by: Gustavus Bryant  Exercises - Heel Toe Raises with Unilateral Counter Support  - 2 x daily - 5 x weekly - 1 sets - 10 reps - Seated Table Hamstring Stretch  - 2 x daily - 5 x weekly - 1 sets - 2 reps - 30s hold - Sit to Stand with Armchair  - 2-3 x daily - 5 x weekly - 1 sets - 5 reps - Curl Up with Arms Crossed  - 2 x daily - 5 x weekly - 2 sets - 10 reps  ASSESSMENT:  CLINICAL IMPRESSION: Rehab goals met, patient ready for independent management.  Per eval - Patient is a 68 y.o. female who was seen today for physical therapy evaluation and treatment for chronic low back pain. She is to undergo ESI in 1 week and has been referred to OPPT to fulfill requirements to undergo recommended lumbar fusion.  She is reluctant to participate in OPPT due to financial reasons as well as unsuccessful conservative care in the past.  Patient was unable to tolerate various testing positions and objective data is sparse.  She was instructed in a HEP to address found deficits but has deferred to schedule f/u sessions until she obtains clarification regarding surgical prerequisites.  OBJECTIVE IMPAIRMENTS: Abnormal gait, decreased activity tolerance, decreased knowledge of condition, decreased mobility, difficulty walking, decreased ROM, decreased strength, postural dysfunction, and pain.   ACTIVITY LIMITATIONS: carrying, lifting, bending, sitting, standing, squatting, stairs, and locomotion level  PERSONAL FACTORS: Behavior pattern, Fitness, Past/current experiences, and Time since onset of injury/illness/exacerbation are also affecting patient's functional outcome.   REHAB POTENTIAL: Fair based on chronicity and advanced degenerative changes  CLINICAL DECISION MAKING: Evolving/moderate complexity  EVALUATION COMPLEXITY: Moderate   GOALS: Goals reviewed with patient? No  SHORT TERM GOALS=LONG TERM  GOALS: Target date: 11/06/2023  Patient to demonstrate independence in HEP  Baseline: [id number] Goal status: Met  2.  Patient will increase 30s chair stand reps from 1 to 2 with/without arms to demonstrate and improved functional ability with less pain/difficulty as well as reduce fall risk.  Baseline: 1; 10/17/23 13 reps  Goal status: Met  3.  Patient will acknowledge 6/10 pain at least once during episode of care   Baseline: 10+/10; 1/22/525 pain range 2-8/10 Goal status: Met  4.  Increase AROM lumbar spine 50% flexion and 10% extension Baseline:  AROM eval 10/17/23  Flexion 25% 90%  Extension 0% 75%   Goal status: Met  5.  Patient will score at least 52% on  FOTO to signify clinically meaningful improvement in functional abilities.   Baseline: 43%; 10/17/23 56% Goal status: Met      PLAN:  PT FREQUENCY: 1x/week  PT DURATION: 6 weeks  PLANNED INTERVENTIONS: [id number]- PT Re-evaluation, 97110-Therapeutic exercises, 97530- Therapeutic activity, [id number]- Neuromuscular re-education, 97535- Self Care, [id number]- Manual therapy, [id number]- Gait training, [phone]- Aquatic Therapy, Dry Needling, Joint mobilization, Spinal mobilization, Cryotherapy, and Moist heat.  PLAN FOR NEXT SESSION: HEP review and update, manual techniques as appropriate, aerobic tasks, ROM and flexibility activities, strengthening and PREs, TPDN, gait and balance training as needed    Learta Codding PT  10/17/2023 12:58 PM

## 2023-11-21 ENCOUNTER — Other Ambulatory Visit: Payer: Self-pay | Admitting: Family Medicine

## 2023-11-21 ENCOUNTER — Ambulatory Visit
Admission: RE | Admit: 2023-11-21 | Discharge: 2023-11-21 | Disposition: A | Discharge status: Home / Self Care | Payer: Medicare Other | Source: Ambulatory Visit | Attending: Family Medicine | Admitting: Family Medicine

## 2023-11-21 DIAGNOSIS — Z01818 Encounter for other preprocedural examination: Secondary | ICD-10-CM

## 2023-11-27 ENCOUNTER — Other Ambulatory Visit: Payer: Self-pay | Admitting: Family Medicine

## 2023-11-27 DIAGNOSIS — Z1231 Encounter for screening mammogram for malignant neoplasm of breast: Secondary | ICD-10-CM

## 2023-11-28 ENCOUNTER — Ambulatory Visit
Admission: RE | Admit: 2023-11-28 | Discharge: 2023-11-28 | Disposition: A | Discharge status: Home / Self Care | Source: Ambulatory Visit | Attending: Family Medicine | Admitting: Family Medicine

## 2023-11-28 DIAGNOSIS — Z1231 Encounter for screening mammogram for malignant neoplasm of breast: Secondary | ICD-10-CM

## 2023-12-10 ENCOUNTER — Telehealth: Payer: Self-pay | Admitting: Cardiology

## 2023-12-10 ENCOUNTER — Ambulatory Visit: Attending: Cardiology | Admitting: Cardiology

## 2023-12-10 ENCOUNTER — Encounter: Payer: Self-pay | Admitting: Cardiology

## 2023-12-10 VITALS — BP 126/72 | HR 72 | Ht 59.0 in | Wt 170.8 lb

## 2023-12-10 DIAGNOSIS — R9431 Abnormal electrocardiogram [ECG] [EKG]: Secondary | ICD-10-CM | POA: Diagnosis not present

## 2023-12-10 DIAGNOSIS — Z01810 Encounter for preprocedural cardiovascular examination: Secondary | ICD-10-CM | POA: Diagnosis not present

## 2023-12-10 DIAGNOSIS — R0609 Other forms of dyspnea: Secondary | ICD-10-CM | POA: Diagnosis not present

## 2023-12-10 DIAGNOSIS — E7800 Pure hypercholesterolemia, unspecified: Secondary | ICD-10-CM

## 2023-12-10 DIAGNOSIS — I10 Essential (primary) hypertension: Secondary | ICD-10-CM | POA: Diagnosis not present

## 2023-12-10 DIAGNOSIS — F17200 Nicotine dependence, unspecified, uncomplicated: Secondary | ICD-10-CM

## 2023-12-10 NOTE — Telephone Encounter (Signed)
 Scheduled patient to see Dr. Sandie Ano this morning at 11:00 am

## 2023-12-10 NOTE — Progress Notes (Signed)
 Cardiology Office Note:    Date:  12/10/2023   ID:  Brooke Mccarthy, DOB 03-09-1956, MRN [id number]  PCP:  Ellyn Hack, MD   [facility] Providers Cardiologist:  Debbe Odea, MD     Referring MD: Ellyn Hack, MD   No chief complaint on file.   History of Present Illness:    Brooke Mccarthy is a 68 y.o. female with a hx of hyperlipidemia, hypertension, current smoker x 30+ years, anxiety, hypothyroidism who presents due to abnormal ECG and preop evaluation.  Patient has degenerative disc disease of her lower back, back surgery being planned.  Followed up with PCP, EKG was noted to be abnormal.  She denies chest pain, endorses shortness of breath with exertion.  History of heart attack in sisters age 2.  Patient is a smoker, compliant with her medications as prescribed.  Had a recent cholesterol check with PCP about 6 weeks ago.  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     BILATERAL FOOT SURGERY     BREAST BIOPSY Left 10/30/2022   MM LT BREAST BX W LOC DEV 1ST LESION IMAGE BX SPEC STEREO GUIDE 10/30/2022 [facility]   BREAST BIOPSY Right 10/30/2022   MM RT BREAST BX W LOC DEV 1ST LESION IMAGE BX SPEC STEREO GUIDE 10/30/2022 [facility]   BREAST EXCISIONAL BIOPSY Right    late 80's   BREAST EXCISIONAL BIOPSY Right    CHOLECYSTECTOMY N/A 09/11/2017   Procedure: LAPAROSCOPIC CHOLECYSTECTOMY;  Surgeon: Jimmye Norman, MD;  Location: [facility] OR;  Service: General;  Laterality: N/A;   ERCP N/A 09/10/2017   Procedure: ENDOSCOPIC RETROGRADE CHOLANGIOPANCREATOGRAPHY (ERCP);  Surgeon: Iva Boop, MD;   Location: [facility] ENDOSCOPY;  Service: Endoscopy;  Laterality: N/A;   FEMUR IM NAIL Left 10/10/2013   Procedure: INTRAMEDULLARY (IM) RETROGRADE FEMORAL NAILING;  Surgeon: Toni Arthurs, MD;  Location: [facility] OR;  Service: Orthopedics;  Laterality: Left;   TUBAL LIGATION      Current Medications: Current Meds  Medication Sig   albuterol (VENTOLIN HFA) 108 (90 Base) MCG/ACT inhaler Inhale 2 puffs into the lungs every 4 (four) hours as needed for wheezing or shortness of breath.   ALPRAZolam (XANAX) 1 MG tablet Take 1-2 tablets by mouth 2 (two) times daily.    azithromycin (ZITHROMAX) 250 MG tablet Take 1 tablet (250 mg total) by mouth daily. Take 1 every day until finished.   buPROPion (WELLBUTRIN XL) 300 MG 24 hr tablet Take 300 mg by mouth daily.   diltiazem (DILACOR XR) 240 MG 24 hr capsule Take 240 mg by mouth daily.   ibuprofen (ADVIL,MOTRIN) 800 MG tablet Take 1 tablet (800 mg total) by mouth every 8 (eight) hours as needed.   levothyroxine (SYNTHROID, LEVOTHROID) 100 MCG tablet Take 100 mcg by mouth daily.    Magnesium Gluconate 550 MG TABS Take 30 mg by mouth.   meloxicam (MOBIC) 15 MG tablet Take 15 mg by mouth daily.   oxyCODONE-acetaminophen (PERCOCET) 10-325 MG tablet Take 1 tablet by mouth every 4 (  four) hours as needed for pain.   pravastatin (PRAVACHOL) 80 MG tablet Take 80 mg by mouth daily.   predniSONE (DELTASONE) 10 MG tablet Take 4 tablets (40 mg total) by mouth daily.   spironolactone-hydrochlorothiazide (ALDACTAZIDE) 25-25 MG per tablet Take 1 tablet by mouth daily.     Allergies:   Hydrocodone-acetaminophen and Ultram [tramadol]   Social History   Socioeconomic History   Marital status: Divorced    Spouse name: Not on file   Number of children: Not on file   Years of education: Not on file   Highest education level: Not on file  Occupational History   Not on file  Tobacco Use   Smoking status: Every Day    Current packs/day: 1.00    Average packs/day: 1 pack/day for  43.0 years (43.0 ttl pk-yrs)    Types: Cigarettes   Smokeless tobacco: Never  Substance and Sexual Activity   Alcohol use: No   Drug use: No   Sexual activity: Never  Other Topics Concern   Not on file  Social History Narrative   Not on file   Social Drivers of Health   Financial Resource Strain: Not on file  Food Insecurity: Not on file  Transportation Needs: Not on file  Physical Activity: Not on file  Stress: Not on file  Social Connections: Unknown (10/18/2022)   [facility]   Social Network    Social Network: Not on file     Family History: The patient's family history includes Cancer in her father and mother. There is no history of Breast cancer.  ROS:   Please see the history of present illness.     All other systems reviewed and are negative.  EKGs/Labs/Other Studies Reviewed:    The following studies were reviewed today:  EKG Interpretation Date/Time:  Monday December 10 2023 11:21:25 EDT Ventricular Rate:  72 PR Interval:  148 QRS Duration:  80 QT Interval:  390 QTC Calculation: 427 R Axis:   17  Text Interpretation: Normal sinus rhythm Septal infarct , age undetermined Confirmed by Debbe Odea ([id number]) on 12/10/2023 11:35:35 AM    Recent Labs: 09/23/2023: BUN 28; Creatinine, Ser 0.84; Hemoglobin 15.4; Platelets 257; Potassium 3.3; Sodium 134  Recent Lipid Panel No results found for: "CHOL", "TRIG", "HDL", "CHOLHDL", "VLDL", "LDLCALC", "LDLDIRECT"   Risk Assessment/Calculations:             Physical Exam:    VS:  BP 126/72 (BP Location: Left Arm, Patient Position: Sitting, Cuff Size: Normal)   Pulse 72   Ht 4\' 11"  (1.499 m)   Wt 170 lb 12.8 oz (77.5 kg)   SpO2 92%   BMI 34.50 kg/m     Wt Readings from Last 3 Encounters:  12/10/23 170 lb 12.8 oz (77.5 kg)  09/23/23 170 lb (77.1 kg)  09/11/17 177 lb 7.5 oz (80.5 kg)     GEN:  Well nourished, well developed in no acute distress HEENT: Normal NECK: No  JVD; No carotid bruits CARDIAC: RRR, no murmurs, rubs, gallops RESPIRATORY:  Clear to auscultation without rales, wheezing or rhonchi  ABDOMEN: Soft, non-tender, non-distended MUSCULOSKELETAL:  No edema; No deformity  SKIN: Warm and dry NEUROLOGIC:  Alert and oriented x 3 PSYCHIATRIC:  Normal affect   ASSESSMENT:    1. Dyspnea on exertion   2. Pre-procedural cardiovascular examination   3. Primary hypertension   4. Abnormal EKG   5. Pure hypercholesterolemia   6. Smoking  PLAN:    In order of problems listed above:  Dyspnea on exertion, likely from smoking.  Denies chest pain.  Get echo, get Lexiscan Myoview. 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  Echo and [employer] as above.  Okay for surgical procedure if no significant structural abnormalities on cardiac testing. Hypertension, BP controlled.  Continue spironolactone-HCTZ 25-25 mg daily. 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 Hyperlipidemia, continue Pravachol 80.  Obtain lipid panel from PCP. Current smoker, cessation advised.  Follow-up after cardiac testing.      Informed Consent   Shared Decision Making/Informed Consent The risks [chest pain, shortness of breath, cardiac arrhythmias, dizziness, blood pressure fluctuations, myocardial infarction, stroke/transient ischemic attack, nausea, vomiting, allergic reaction, radiation exposure, metallic taste sensation and life-threatening complications (estimated to be 1 in 10,000)], benefits (risk stratification, diagnosing coronary artery disease, treatment guidance) and alternatives of a nuclear stress test were discussed in detail with Ms. Mazer and she agrees to proceed.       Medication Adjustments/Labs and Tests Ordered: Current medicines are reviewed at length with the patient today.  Concerns regarding medicines are outlined above.  Orders Placed This Encounter  Procedures   NM Myocar Multi W/Spect W/Wall Motion / EF   EKG  12-Lead   ECHOCARDIOGRAM COMPLETE   No orders of the defined types were placed in this encounter.   Patient Instructions  Medication Instructions:  Your physician recommends that you continue on your current medications as directed. Please refer to the Current Medication list given to you today.   *If you need a refill on your cardiac medications before your next appointment, please call your pharmacy*   Lab Work: No labs ordered today    Testing/Procedures: Your physician has requested that you have an echocardiogram. Echocardiography is a painless test that uses sound waves to create images of your heart. It provides your doctor with information about the size and shape of your heart and how well your heart's chambers and valves are working.   You may receive an ultrasound enhancing agent through an IV if needed to better visualize your heart during the echo. This procedure takes approximately one hour.  There are no restrictions for this procedure.  This will take place at [address] [facility] [address]) [address], [place] [address]  Please note: We ask at that you not bring children with you during ultrasound (echo/ vascular) testing. Due to room size and safety concerns, children are not allowed in the ultrasound rooms during exams. Our front office staff cannot provide observation of children in our lobby area while testing is being conducted. An adult accompanying a patient to their appointment will only be allowed in the ultrasound room at the discretion of the ultrasound technician under special circumstances. We apologize for any inconvenience.   Your provider has ordered a Lexiscan/ Exercise Myoview Stress test. This will take place at [facility]. Please report to the [facility] medical mall entrance. The volunteers at the first desk will direct you where to go.  [facility] MYOVIEW  Your provider has ordered a Stress Test with nuclear imaging. The purpose of this test is to evaluate the  blood supply to your heart muscle. This procedure is referred to as a "Non-Invasive Stress Test." This is because other than having an IV started in your vein, nothing is inserted or "invades" your body. Cardiac stress tests are done to find areas of poor blood flow to the heart by determining the extent of coronary artery  disease (CAD). Some patients exercise on a treadmill, which naturally increases the blood flow to your heart, while others who are unable to walk on a treadmill due to physical limitations will have a pharmacologic/chemical stress agent called Lexiscan . This medicine will mimic walking on a treadmill by temporarily increasing your coronary blood flow.   Please note: these test may take anywhere between 2-4 hours to complete  How to prepare for your Myoview test:  Nothing to eat for 6 hours prior to the test No caffeine for 24 hours prior to test No smoking 24 hours prior to test. Your medication may be taken with water.  If your doctor stopped a medication because of this test, do not take that medication. Ladies, please do not wear dresses.  Skirts or pants are appropriate. Please wear a short sleeve shirt. No perfume, cologne or lotion. Wear comfortable walking shoes. No heels!   PLEASE NOTIFY THE OFFICE AT LEAST 24 HOURS IN ADVANCE IF YOU ARE UNABLE TO KEEP YOUR APPOINTMENT.  [phone] AND  PLEASE NOTIFY NUCLEAR MEDICINE AT [facility] AT LEAST 24 HOURS IN ADVANCE IF YOU ARE UNABLE TO KEEP YOUR APPOINTMENT. [phone]    Follow-Up: At [facility], you and your health needs are our priority.  As part of our continuing mission to provide you with exceptional heart care, we have created designated Provider Care Teams.  These Care Teams include your primary Cardiologist (physician) and Advanced Practice Providers (APPs -  Physician Assistants and Nurse Practitioners) who all work together to provide you with the care you need, when you need it.  We recommend signing  up for the patient portal called "MyChart".  Sign up information is provided on this After Visit Summary.  MyChart is used to connect with patients for Virtual Visits (Telemedicine).  Patients are able to view lab/test results, encounter notes, upcoming appointments, etc.  Non-urgent messages can be sent to your provider as well.   To learn more about what you can do with MyChart, go to [email or web address].    Your next appointment:   2-3 month(s)  Provider:   You may see Debbe Odea, MD or one of the following Advanced Practice Providers on your designated Care Team:   Nicolasa Ducking, NP Eula Listen, PA-C Cadence Fransico Michael, PA-C Charlsie Quest, NP Carlos Levering, NP       Signed, Debbe Odea, MD  12/10/2023 12:52 PM    [facility]

## 2023-12-10 NOTE — Patient Instructions (Signed)
 Medication Instructions:  Your physician recommends that you continue on your current medications as directed. Please refer to the Current Medication list given to you today.   *If you need a refill on your cardiac medications before your next appointment, please call your pharmacy*   Lab Work: No labs ordered today    Testing/Procedures: Your physician has requested that you have an echocardiogram. Echocardiography is a painless test that uses sound waves to create images of your heart. It provides your doctor with information about the size and shape of your heart and how well your heart's chambers and valves are working.   You may receive an ultrasound enhancing agent through an IV if needed to better visualize your heart during the echo. This procedure takes approximately one hour.  There are no restrictions for this procedure.  This will take place at [address] [facility] [address]) [address], [place] [address]  Please note: We ask at that you not bring children with you during ultrasound (echo/ vascular) testing. Due to room size and safety concerns, children are not allowed in the ultrasound rooms during exams. Our front office staff cannot provide observation of children in our lobby area while testing is being conducted. An adult accompanying a patient to their appointment will only be allowed in the ultrasound room at the discretion of the ultrasound technician under special circumstances. We apologize for any inconvenience.   Your provider has ordered a Lexiscan/ Exercise Myoview Stress test. This will take place at [facility]. Please report to the [facility] medical mall entrance. The volunteers at the first desk will direct you where to go.  [facility] MYOVIEW  Your provider has ordered a Stress Test with nuclear imaging. The purpose of this test is to evaluate the blood supply to your heart muscle. This procedure is referred to as a "Non-Invasive Stress Test." This is because other than  having an IV started in your vein, nothing is inserted or "invades" your body. Cardiac stress tests are done to find areas of poor blood flow to the heart by determining the extent of coronary artery disease (CAD). Some patients exercise on a treadmill, which naturally increases the blood flow to your heart, while others who are unable to walk on a treadmill due to physical limitations will have a pharmacologic/chemical stress agent called Lexiscan . This medicine will mimic walking on a treadmill by temporarily increasing your coronary blood flow.   Please note: these test may take anywhere between 2-4 hours to complete  How to prepare for your Myoview test:  Nothing to eat for 6 hours prior to the test No caffeine for 24 hours prior to test No smoking 24 hours prior to test. Your medication may be taken with water.  If your doctor stopped a medication because of this test, do not take that medication. Ladies, please do not wear dresses.  Skirts or pants are appropriate. Please wear a short sleeve shirt. No perfume, cologne or lotion. Wear comfortable walking shoes. No heels!   PLEASE NOTIFY THE OFFICE AT LEAST 24 HOURS IN ADVANCE IF YOU ARE UNABLE TO KEEP YOUR APPOINTMENT.  [phone] AND  PLEASE NOTIFY NUCLEAR MEDICINE AT [facility] AT LEAST 24 HOURS IN ADVANCE IF YOU ARE UNABLE TO KEEP YOUR APPOINTMENT. [phone]    Follow-Up: At [facility], you and your health needs are our priority.  As part of our continuing mission to provide you with exceptional heart care, we have created designated Provider Care Teams.  These Care  Teams include your primary Cardiologist (physician) and Advanced Practice Providers (APPs -  Physician Assistants and Nurse Practitioners) who all work together to provide you with the care you need, when you need it.  We recommend signing up for the patient portal called "MyChart".  Sign up information is provided on this After Visit Summary.  MyChart is used  to connect with patients for Virtual Visits (Telemedicine).  Patients are able to view lab/test results, encounter notes, upcoming appointments, etc.  Non-urgent messages can be sent to your provider as well.   To learn more about what you can do with MyChart, go to [email or web address].    Your next appointment:   2-3 month(s)  Provider:   You may see Debbe Odea, MD or one of the following Advanced Practice Providers on your designated Care Team:   Nicolasa Ducking, NP Eula Listen, PA-C Cadence Fransico Michael, PA-C Charlsie Quest, NP Carlos Levering, NP

## 2023-12-13 ENCOUNTER — Ambulatory Visit (HOSPITAL_COMMUNITY): Attending: Cardiovascular Disease

## 2023-12-13 DIAGNOSIS — R0609 Other forms of dyspnea: Secondary | ICD-10-CM | POA: Diagnosis present

## 2023-12-13 LAB — ECHOCARDIOGRAM COMPLETE
Area-P 1/2: 2.91 cm2
S' Lateral: 2.5 cm

## 2023-12-14 ENCOUNTER — Encounter: Payer: Self-pay | Admitting: *Deleted

## 2023-12-14 ENCOUNTER — Ambulatory Visit
Admission: RE | Admit: 2023-12-14 | Discharge: 2023-12-14 | Disposition: A | Discharge status: Home / Self Care | Source: Ambulatory Visit | Attending: Cardiology | Admitting: Cardiology

## 2023-12-14 DIAGNOSIS — R0609 Other forms of dyspnea: Secondary | ICD-10-CM | POA: Insufficient documentation

## 2023-12-14 MED ORDER — TECHNETIUM TC 99M TETROFOSMIN IV KIT: 10.0000 | PACK | Freq: Once | INTRAVENOUS | Status: DC | PRN

## 2023-12-17 ENCOUNTER — Ambulatory Visit
Admission: RE | Admit: 2023-12-17 | Discharge: 2023-12-17 | Disposition: A | Discharge status: Home / Self Care | Source: Ambulatory Visit | Attending: Cardiology | Admitting: Cardiology

## 2023-12-17 ENCOUNTER — Encounter: Payer: Self-pay | Admitting: Emergency Medicine

## 2023-12-17 DIAGNOSIS — R0609 Other forms of dyspnea: Secondary | ICD-10-CM | POA: Insufficient documentation

## 2023-12-17 LAB — NM MYOCAR MULTI W/SPECT W/WALL MOTION / EF
LV dias vol: 56 mL (ref 46–106)
LV sys vol: 17 mL
MPHR: 151 {beats}/min
Nuc Stress EF: 70 %
Peak HR: 85 {beats}/min
Percent HR: 56 %
Rest HR: 60 {beats}/min
Rest Nuclear Isotope Dose: 11 mCi
SDS: 0
SRS: 9
SSS: 9
ST Depression (mm): 0 mm
Stress Nuclear Isotope Dose: 31.4 mCi
TID: 0.85

## 2023-12-17 MED ORDER — REGADENOSON 0.4 MG/5ML IV SOLN
0.4000 mg | Freq: Once | INTRAVENOUS | Status: AC
  Administered 2023-12-17: 0.4 mg via INTRAVENOUS

## 2023-12-17 MED ORDER — TECHNETIUM TC 99M TETROFOSMIN IV KIT
31.4300 | PACK | Freq: Once | INTRAVENOUS | Status: AC | PRN
  Administered 2023-12-17: 31.43 via INTRAVENOUS

## 2023-12-17 MED ORDER — TECHNETIUM TC 99M TETROFOSMIN IV KIT
10.0000 | PACK | Freq: Once | INTRAVENOUS | Status: AC | PRN
  Administered 2023-12-17: 11 via INTRAVENOUS

## 2023-12-20 ENCOUNTER — Telehealth: Payer: Self-pay | Admitting: Cardiology

## 2023-12-20 NOTE — Telephone Encounter (Signed)
 Returned the call to the patient. She was calling for her results to the stress test. She has been advised that once Dr. Azucena Cecil interprets them that we will call her back.

## 2023-12-20 NOTE — Telephone Encounter (Signed)
 Pt calling in regards to her sleep study results. Please advise

## 2023-12-21 ENCOUNTER — Encounter: Payer: Self-pay | Admitting: Emergency Medicine

## 2023-12-28 ENCOUNTER — Other Ambulatory Visit: Payer: Self-pay | Admitting: Neurological Surgery

## 2024-01-04 NOTE — Progress Notes (Signed)
 Surgical Instructions   Your procedure is scheduled on Tuesday, January 15, 2024. Report to [facility] Main Entrance "A" at 5:30 A.M., then check in with the Admitting office. Any questions or running late day of surgery: call [phone]  Questions prior to your surgery date: call [phone], Monday-Friday, 8am-4pm. If you experience any cold or flu symptoms such as cough, fever, chills, shortness of breath, etc. between now and your scheduled surgery, please notify us at the above number.     Remember:  Do not eat or drink after midnight the night before your surgery  Take these medicines the morning of surgery with A SIP OF WATER  albuterol (VENTOLIN HFA)  buPROPion (WELLBUTRIN XL)  diltiazem (DILACOR XR)  levothyroxine (SYNTHROID, LEVOTHROID)  pravastatin (PRAVACHOL)  predniSONE (DELTASONE)    May take these medicines IF NEEDED: ALPRAZolam (XANAX)  oxyCODONE-acetaminophen (PERCOCET)   One week prior to surgery, STOP taking any Aspirin (unless otherwise instructed by your surgeon) Aleve, Naproxen,Motrin, Goody's, BC's, all herbal medications, fish oil, and non-prescription vitamins. This includes your ibuprofen (ADVIL) and meloxicam (MOBIC).                     Do NOT Smoke (Tobacco/Vaping) for 24 hours prior to your procedure.  If you use a CPAP at night, you may bring your mask/headgear for your overnight stay.   You will be asked to remove any contacts, glasses, piercing's, hearing aid's, dentures/partials prior to surgery. Please bring cases for these items if needed.    Patients discharged the day of surgery will not be allowed to drive home, and someone needs to stay with them for 24 hours.  SURGICAL WAITING ROOM VISITATION Patients may have no more than 2 support people in the waiting area - these visitors may rotate.   Pre-op nurse will coordinate an appropriate time for 1 ADULT support person, who may not rotate, to accompany patient in pre-op.  Children under  the age of 34 must have an adult with them who is not the patient and must remain in the main waiting area with an adult.  If the patient needs to stay at the hospital during part of their recovery, the visitor guidelines for inpatient rooms apply.  Please refer to the [facility] website for the visitor guidelines for any additional information.   If you received a COVID test during your pre-op visit  it is requested that you wear a mask when out in public, stay away from anyone that may not be feeling well and notify your surgeon if you develop symptoms. If you have been in contact with anyone that has tested positive in the last 10 days please notify you surgeon.      Pre-operative 5 CHG Bathing Instructions   You can play a key role in reducing the risk of infection after surgery. Your skin needs to be as free of germs as possible. You can reduce the number of germs on your skin by washing with CHG (chlorhexidine gluconate) soap before surgery. CHG is an antiseptic soap that kills germs and continues to kill germs even after washing.   DO NOT use if you have an allergy to chlorhexidine/CHG or antibacterial soaps. If your skin becomes reddened or irritated, stop using the CHG and notify one of our RNs at [phone].   Please shower with the CHG soap starting 4 days before surgery using the following schedule:     Please keep in mind the following:  DO NOT shave,  including legs and underarms, starting the day of your first shower.   You may shave your face at any point before/day of surgery.  Place clean sheets on your bed the day you start using CHG soap. Use a clean washcloth (not used since being washed) for each shower. DO NOT sleep with pets once you start using the CHG.   CHG Shower Instructions:  Wash your face and private area with normal soap. If you choose to wash your hair, wash first with your normal shampoo.  After you use shampoo/soap, rinse your hair and body  thoroughly to remove shampoo/soap residue.  Turn the water OFF and apply about 3 tablespoons (45 ml) of CHG soap to a CLEAN washcloth.  Apply CHG soap ONLY FROM YOUR NECK DOWN TO YOUR TOES (washing for 3-5 minutes)  DO NOT use CHG soap on face, private areas, open wounds, or sores.  Pay special attention to the area where your surgery is being performed.  If you are having back surgery, having someone wash your back for you may be helpful. Wait 2 minutes after CHG soap is applied, then you may rinse off the CHG soap.  Pat dry with a clean towel  Put on clean clothes/pajamas   If you choose to wear lotion, please use ONLY the CHG-compatible lotions that are listed below.  Additional instructions for the day of surgery: DO NOT APPLY any lotions, deodorants, cologne, or perfumes.   Do not bring valuables to the hospital. [facility] is not responsible for any belongings/valuables. Do not wear nail polish, gel polish, artificial nails, or any other type of covering on natural nails (fingers and toes) Do not wear jewelry or makeup Put on clean/comfortable clothes.  Please brush your teeth.  Ask your nurse before applying any prescription medications to the skin.     CHG Compatible Lotions   Aveeno Moisturizing lotion  Cetaphil Moisturizing Cream  Cetaphil Moisturizing Lotion  Clairol Herbal Essence Moisturizing Lotion, Dry Skin  Clairol Herbal Essence Moisturizing Lotion, Extra Dry Skin  Clairol Herbal Essence Moisturizing Lotion, Normal Skin  Curel Age Defying Therapeutic Moisturizing Lotion with Alpha Hydroxy  Curel Extreme Care Body Lotion  Curel Soothing Hands Moisturizing Hand Lotion  Curel Therapeutic Moisturizing Cream, Fragrance-Free  Curel Therapeutic Moisturizing Lotion, Fragrance-Free  Curel Therapeutic Moisturizing Lotion, Original Formula  Eucerin Daily Replenishing Lotion  Eucerin Dry Skin Therapy Plus Alpha Hydroxy Crme  Eucerin Dry Skin Therapy Plus Alpha Hydroxy  Lotion  Eucerin Original Crme  Eucerin Original Lotion  Eucerin Plus Crme Eucerin Plus Lotion  Eucerin TriLipid Replenishing Lotion  Keri Anti-Bacterial Hand Lotion  Keri Deep Conditioning Original Lotion Dry Skin Formula Softly Scented  Keri Deep Conditioning Original Lotion, Fragrance Free Sensitive Skin Formula  Keri Lotion Fast Absorbing Fragrance Free Sensitive Skin Formula  Keri Lotion Fast Absorbing Softly Scented Dry Skin Formula  Keri Original Lotion  Keri Skin Renewal Lotion Keri Silky Smooth Lotion  Keri Silky Smooth Sensitive Skin Lotion  Nivea Body Creamy Conditioning Oil  Nivea Body Extra Enriched Lotion  Nivea Body Original Lotion  Nivea Body Sheer Moisturizing Lotion Nivea Crme  Nivea Skin Firming Lotion  NutraDerm 30 Skin Lotion  NutraDerm Skin Lotion  NutraDerm Therapeutic Skin Cream  NutraDerm Therapeutic Skin Lotion  ProShield Protective Hand Cream  Provon moisturizing lotion  Please read over the following fact sheets that you were given.

## 2024-01-07 ENCOUNTER — Other Ambulatory Visit: Payer: Self-pay

## 2024-01-07 ENCOUNTER — Encounter (HOSPITAL_COMMUNITY): Payer: Self-pay

## 2024-01-07 ENCOUNTER — Encounter (HOSPITAL_COMMUNITY)
Admission: RE | Admit: 2024-01-07 | Discharge: 2024-01-07 | Disposition: A | Discharge status: Home / Self Care | Source: Ambulatory Visit | Attending: Neurological Surgery | Admitting: Neurological Surgery

## 2024-01-07 VITALS — BP 107/73 | HR 64 | Temp 97.9°F | Resp 17 | Ht 59.0 in | Wt 171.4 lb

## 2024-01-07 DIAGNOSIS — I10 Essential (primary) hypertension: Secondary | ICD-10-CM | POA: Insufficient documentation

## 2024-01-07 DIAGNOSIS — E785 Hyperlipidemia, unspecified: Secondary | ICD-10-CM | POA: Diagnosis not present

## 2024-01-07 DIAGNOSIS — J449 Chronic obstructive pulmonary disease, unspecified: Secondary | ICD-10-CM | POA: Insufficient documentation

## 2024-01-07 DIAGNOSIS — K219 Gastro-esophageal reflux disease without esophagitis: Secondary | ICD-10-CM | POA: Diagnosis not present

## 2024-01-07 DIAGNOSIS — E039 Hypothyroidism, unspecified: Secondary | ICD-10-CM | POA: Diagnosis not present

## 2024-01-07 DIAGNOSIS — I2510 Atherosclerotic heart disease of native coronary artery without angina pectoris: Secondary | ICD-10-CM | POA: Insufficient documentation

## 2024-01-07 DIAGNOSIS — F419 Anxiety disorder, unspecified: Secondary | ICD-10-CM | POA: Insufficient documentation

## 2024-01-07 DIAGNOSIS — F17210 Nicotine dependence, cigarettes, uncomplicated: Secondary | ICD-10-CM | POA: Insufficient documentation

## 2024-01-07 DIAGNOSIS — M549 Dorsalgia, unspecified: Secondary | ICD-10-CM | POA: Diagnosis not present

## 2024-01-07 DIAGNOSIS — Z01812 Encounter for preprocedural laboratory examination: Secondary | ICD-10-CM | POA: Diagnosis present

## 2024-01-07 DIAGNOSIS — G8929 Other chronic pain: Secondary | ICD-10-CM | POA: Insufficient documentation

## 2024-01-07 DIAGNOSIS — M4316 Spondylolisthesis, lumbar region: Secondary | ICD-10-CM | POA: Diagnosis not present

## 2024-01-07 DIAGNOSIS — Z01818 Encounter for other preprocedural examination: Secondary | ICD-10-CM

## 2024-01-07 HISTORY — DX: Chronic obstructive pulmonary disease, unspecified: J44.9

## 2024-01-07 LAB — TYPE AND SCREEN
ABO/RH(D): O POS
Antibody Screen: NEGATIVE

## 2024-01-07 LAB — BASIC METABOLIC PANEL WITH GFR
Anion gap: 12 (ref 5–15)
BUN: 20 mg/dL (ref 8–23)
CO2: 27 mmol/L (ref 22–32)
Calcium: 10 mg/dL (ref 8.9–10.3)
Chloride: 97 mmol/L — ABNORMAL LOW (ref 98–111)
Creatinine, Ser: 0.92 mg/dL (ref 0.44–1.00)
GFR, Estimated: >60 mL/min (ref >60)
Glucose, Bld: 144 mg/dL — ABNORMAL HIGH (ref 70–99)
Potassium: 3.7 mmol/L (ref 3.5–5.1)
Sodium: 136 mmol/L (ref 135–145)

## 2024-01-07 LAB — CBC
HCT: 42.9 % (ref 36.0–46.0)
Hemoglobin: 14.2 g/dL (ref 12.0–15.0)
MCH: 27.7 pg (ref 26.0–34.0)
MCHC: 33.1 g/dL (ref 30.0–36.0)
MCV: 83.8 fL (ref 80.0–100.0)
Platelets: 308 10*3/uL (ref 150–400)
RBC: 5.12 MIL/uL — ABNORMAL HIGH (ref 3.87–5.11)
RDW: 13.5 % (ref 11.5–15.5)
WBC: 10.5 10*3/uL (ref 4.0–10.5)
nRBC: 0 % (ref 0.0–0.2)

## 2024-01-07 LAB — SURGICAL PCR SCREEN
MRSA, PCR: NEGATIVE
Staphylococcus aureus: NEGATIVE

## 2024-01-07 NOTE — Progress Notes (Signed)
 PCP - Shan Dare at [facility] Cardiologist - Polly Brink Agbur-Etang     - clearance 12/11/23  PPM/ICD - denies Device Orders - n/a Rep Notified - n/a  Chest x-ray - denies EKG - 12/10/23 Stress Test - 12/17/23 ECHO - 12/13/23 Cardiac Cath - denies  Sleep Study - denies  No DM  Last dose of GLP1 agonist-  n/a GLP1 instructions: n/a  Blood Thinner Instructions: n/a Aspirin Instructions: n/a  ERAS Protcol -  NPO   COVID TEST- n/a   Anesthesia review: yes - cardiac clearance  Patient denies shortness of breath, fever, cough and chest pain at PAT appointment   All instructions explained to the patient, with a verbal understanding of the material. Patient agrees to go over the instructions while at home for a better understanding. Patient also instructed to self quarantine after being tested for COVID-19. The opportunity to ask questions was provided.

## 2024-01-07 NOTE — Progress Notes (Signed)
 Surgical Instructions     Your procedure is scheduled on Tuesday, January 15, 2024. Report to [facility] Main Entrance "A" at 5:30 A.M., then check in with the Admitting office. Any questions or running late day of surgery: call [phone]   Questions prior to your surgery date: call [phone], Monday-Friday, 8am-4pm. If you experience any cold or flu symptoms such as cough, fever, chills, shortness of breath, etc. between now and your scheduled surgery, please notify us at the above number.            Remember:       Do not eat or drink after midnight the night before your surgery   Take these medicines the morning of surgery with A SIP OF WATER  Amlodipine (Norvasc) buPROPion (WELLBUTRIN XL)  Famotidine (Pepcid) Levothyroxine (Synthroid) Sertraline (Zoloft)     May take these medicines IF NEEDED: Albuterol Inhaler - please bring with you on the day of surgery ALPRAZolam Prudy Feeler)  oxyCODONE-acetaminophen (PERCOCET)    One week prior to surgery, STOP taking any Aspirin (unless otherwise instructed by your surgeon) Aleve, Naproxen,Motrin, Goody's, BC's, all herbal medications, fish oil, and non-prescription vitamins. This includes your ibuprofen (ADVIL) and meloxicam (MOBIC).                     Do NOT Smoke (Tobacco/Vaping) for 24 hours prior to your procedure.   If you use a CPAP at night, you may bring your mask/headgear for your overnight stay.   You will be asked to remove any contacts, glasses, piercing's, hearing aid's, dentures/partials prior to surgery. Please bring cases for these items if needed.    Patients discharged the day of surgery will not be allowed to drive home, and someone needs to stay with them for 24 hours.   SURGICAL WAITING ROOM VISITATION Patients may have no more than 2 support people in the waiting area - these visitors may rotate.   Pre-op nurse will coordinate an appropriate time for 1 ADULT support person, who may not rotate, to accompany  patient in pre-op.  Children under the age of 46 must have an adult with them who is not the patient and must remain in the main waiting area with an adult.   If the patient needs to stay at the hospital during part of their recovery, the visitor guidelines for inpatient rooms apply.   Please refer to the [facility] website for the visitor guidelines for any additional information.     If you received a COVID test during your pre-op visit  it is requested that you wear a mask when out in public, stay away from anyone that may not be feeling well and notify your surgeon if you develop symptoms. If you have been in contact with anyone that has tested positive in the last 10 days please notify you surgeon.         Pre-operative 5 CHG Bathing Instructions    You can play a key role in reducing the risk of infection after surgery. Your skin needs to be as free of germs as possible. You can reduce the number of germs on your skin by washing with CHG (chlorhexidine gluconate) soap before surgery. CHG is an antiseptic soap that kills germs and continues to kill germs even after washing.    DO NOT use if you have an allergy to chlorhexidine/CHG or antibacterial soaps. If your skin becomes reddened or irritated, stop using the CHG and notify one of our RNs at  [phone].    Please shower with the CHG soap starting 4 days before surgery using the following schedule:       Please keep in mind the following:  DO NOT shave, including legs and underarms, starting the day of your first shower.   You may shave your face at any point before/day of surgery.  Place clean sheets on your bed the day you start using CHG soap. Use a clean washcloth (not used since being washed) for each shower. DO NOT sleep with pets once you start using the CHG.    CHG Shower Instructions:  Wash your face and private area with normal soap. If you choose to wash your hair, wash first with your normal shampoo.  After you  use shampoo/soap, rinse your hair and body thoroughly to remove shampoo/soap residue.  Turn the water OFF and apply about 3 tablespoons (45 ml) of CHG soap to a CLEAN washcloth.  Apply CHG soap ONLY FROM YOUR NECK DOWN TO YOUR TOES (washing for 3-5 minutes)  DO NOT use CHG soap on face, private areas, open wounds, or sores.  Pay special attention to the area where your surgery is being performed.  If you are having back surgery, having someone wash your back for you may be helpful. Wait 2 minutes after CHG soap is applied, then you may rinse off the CHG soap.  Pat dry with a clean towel  Put on clean clothes/pajamas   If you choose to wear lotion, please use ONLY the CHG-compatible lotions that are listed below.   Additional instructions for the day of surgery: DO NOT APPLY any lotions, deodorants, cologne, or perfumes.   Do not bring valuables to the hospital. [facility] is not responsible for any belongings/valuables. Do not wear nail polish, gel polish, artificial nails, or any other type of covering on natural nails (fingers and toes) Do not wear jewelry or makeup Put on clean/comfortable clothes.  Please brush your teeth.  Ask your nurse before applying any prescription medications to the skin.        CHG Compatible Lotions    Aveeno Moisturizing lotion  Cetaphil Moisturizing Cream  Cetaphil Moisturizing Lotion  Clairol Herbal Essence Moisturizing Lotion, Dry Skin  Clairol Herbal Essence Moisturizing Lotion, Extra Dry Skin  Clairol Herbal Essence Moisturizing Lotion, Normal Skin  Curel Age Defying Therapeutic Moisturizing Lotion with Alpha Hydroxy  Curel Extreme Care Body Lotion  Curel Soothing Hands Moisturizing Hand Lotion  Curel Therapeutic Moisturizing Cream, Fragrance-Free  Curel Therapeutic Moisturizing Lotion, Fragrance-Free  Curel Therapeutic Moisturizing Lotion, Original Formula  Eucerin Daily Replenishing Lotion  Eucerin Dry Skin Therapy Plus Alpha Hydroxy  Crme  Eucerin Dry Skin Therapy Plus Alpha Hydroxy Lotion  Eucerin Original Crme  Eucerin Original Lotion  Eucerin Plus Crme Eucerin Plus Lotion  Eucerin TriLipid Replenishing Lotion  Keri Anti-Bacterial Hand Lotion  Keri Deep Conditioning Original Lotion Dry Skin Formula Softly Scented  Keri Deep Conditioning Original Lotion, Fragrance Free Sensitive Skin Formula  Keri Lotion Fast Absorbing Fragrance Free Sensitive Skin Formula  Keri Lotion Fast Absorbing Softly Scented Dry Skin Formula  Keri Original Lotion  Keri Skin Renewal Lotion Keri Silky Smooth Lotion  Keri Silky Smooth Sensitive Skin Lotion  Nivea Body Creamy Conditioning Oil  Nivea Body Extra Enriched Teacher, adult education Moisturizing Lotion Nivea Crme  Nivea Skin Firming Lotion  NutraDerm 30 Skin Lotion  NutraDerm Skin Lotion  NutraDerm Therapeutic Skin Cream  NutraDerm Therapeutic Skin Lotion  ProShield Protective Hand Cream  Provon moisturizing lotion   Please read over the following fact sheets that you were given.

## 2024-01-08 NOTE — Anesthesia Preprocedure Evaluation (Addendum)
 Anesthesia Evaluation  Patient identified by MRN, date of birth, ID band Patient awake    Reviewed: Allergy & Precautions, NPO status , Patient's Chart, lab work & pertinent test results  History of Anesthesia Complications (+) PONV and history of anesthetic complications  Airway Mallampati: [name]  TM Distance: >3 FB Neck ROM: Full    Dental  (+) Poor Dentition, Missing,    Pulmonary neg shortness of breath, neg sleep apnea, COPD,  COPD inhaler, neg recent URI, Current Smoker and Patient abstained from smoking.   breath sounds clear to auscultation       Cardiovascular hypertension, Pt. on medications (-) angina (-) Past MI and (-) CHF  Rhythm:Regular  1. Left ventricular ejection fraction, by estimation, is 60 to 65%. Left  ventricular ejection fraction by 3D volume is 65 %. The left ventricle has  normal function. The left ventricle has no regional wall motion  abnormalities. Left ventricular diastolic   parameters are consistent with Grade I diastolic dysfunction (impaired  relaxation). The average left ventricular global longitudinal strain is  -17.8 %. The global longitudinal strain is normal.   2. Right ventricular systolic function is normal. The right ventricular  size is normal. There is normal pulmonary artery systolic pressure.   3. The mitral valve is normal in structure. Trivial mitral valve  regurgitation. No evidence of mitral stenosis.   4. The aortic valve is tricuspid. Aortic valve regurgitation is not  visualized. No aortic stenosis is present.   5. The inferior vena cava is normal in size with greater than 50%  respiratory variability, suggesting right atrial pressure of 3 mmHg.     Neuro/Psych neg Seizures PSYCHIATRIC DISORDERS Anxiety      Neuromuscular disease    GI/Hepatic Neg liver ROS,GERD  Medicated and Controlled,,  Endo/Other  Hypothyroidism  No results found for: "HGBA1C"   Renal/GU negative  Renal ROSLab Results      Component                Value               Date                      NA                       136                 01/07/2024                K                        3.7                 01/07/2024                CO2                      27                  01/07/2024                GLUCOSE                  144 (H)             01/07/2024  BUN                      20                  01/07/2024                CREATININE               0.92                01/07/2024                CALCIUM                   10.0                01/07/2024                GFRNONAA                 >60                 01/07/2024                Musculoskeletal  (+) Arthritis ,    Abdominal   Peds  Hematology negative hematology ROS (+) Lab Results      Component                Value               Date                      WBC                      10.5                01/07/2024                HGB                      14.2                01/07/2024                HCT                      42.9                01/07/2024                MCV                      83.8                01/07/2024                PLT                      308                 01/07/2024              Anesthesia Other Findings   Reproductive/Obstetrics                              Anesthesia Physical Anesthesia Plan  ASA: 3  Anesthesia Plan: General   Post-op Pain  Management: Ofirmev  IV (intra-op)*   Induction: Intravenous  PONV Risk Score and Plan: 3 and Ondansetron  and Dexamethasone   Airway Management Planned: Oral ETT  Additional Equipment: None  Intra-op Plan:   Post-operative Plan: Extubation in OR  Informed Consent: I have reviewed the patients History and Physical, chart, labs and discussed the procedure including the risks, benefits and alternatives for the proposed anesthesia with the patient or authorized representative who has indicated his/her  understanding and acceptance.     Dental advisory given  Plan Discussed with: CRNA  Anesthesia Plan Comments: (PAT note written 01/08/2024 by Allison Zelenak, PA-C.  )        Anesthesia Quick Evaluation

## 2024-01-08 NOTE — Progress Notes (Signed)
 Anesthesia Chart Review:  Case: [id number] Date/Time: 01/15/24 0715   Procedure: POSTERIOR LUMBAR FUSION 1 LEVEL (Back) - PLIF L34   Anesthesia type: General   Diagnosis: Spondylolisthesis, lumbar region [M43.16]   Pre-op diagnosis: SPONDYLOLISTHESIS, LUMBAR REGION   [facility]: [facility] ROOM [facility] / [facility]   Surgeons: Barnett Abu, MD       DISCUSSION: Patient is a 68 year old female scheduled for the above procedure.  History includes smoking, postoperative N/V, HLD, HTN, COPD, GERD, hypothyroidism, chronic back pain, anxiety, spinal surgery (left L4-5 discectomy 02/02/00).  She had a cardiology evaluation by Dr. 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 Smoker with +DOE. Stress test and echo ordered.  12/13/2023 echocardiogram showed LVEF 60 to 65%, no regional wall motion abnormalities, grade 1 diastolic dysfunction, normal RV systolic function, normal PASP, trivial MR.  12/17/2023 nuclear stress test was low risk with no evidence of ischemia. After review, he wrote, " Okay for surgical procedure from a cardiac perspective."  Anesthesia team to evaluate on the day of surgery.   VS: BP 107/73   Pulse 64   Temp 36.6 C   Resp 17   Ht 4\' 11"  (1.499 m)   Wt 77.7 kg   SpO2 96%   BMI 34.62 kg/m   PROVIDERS: Ellyn Hack, MD is PCP [facility]) Debbe Odea, MD is cardiologist   LABS: Labs reviewed: Acceptable for surgery. (all labs ordered are listed, but only abnormal results are displayed)  Labs Reviewed  CBC - Abnormal; Notable for the following components:      Result Value   RBC 5.12 (*)    All other components within normal limits  BASIC METABOLIC PANEL WITH GFR - Abnormal; Notable for the following components:   Chloride 97 (*)    Glucose, Bld 144 (*)    All other components within normal limits  SURGICAL PCR SCREEN  TYPE AND SCREEN     IMAGES: CXR 11/21/23: FINDINGS: - The heart size  and mediastinal contours are within normal limits. Both lungs are clear. The visualized skeletal structures are unremarkable. - Minimal COPD changes with hyperinflation of both lung bases. - Spondylo arthrosis thoracic spine IMPRESSION: No acute cardiopulmonary process.    EKG: 12/10/23: Normal sinus rhythm Septal infarct , age undetermined Confirmed by Debbe Odea ([id number]) on 12/10/2023 11:35:35 AM   CV: Nuclear stress test 12/17/23:   The study is normal. The study is low risk.   No ST deviation was noted.   LV perfusion is normal. There is no evidence of ischemia. There is no evidence of infarction.   Left ventricular function is normal. Nuclear stress EF: 70%. The left ventricular ejection fraction is normal (55-65%). End diastolic cavity size is normal. End systolic cavity size is normal.   CT attenuation images showed mild aortic and coronary calcifications.    Echo 12/13/23: IMPRESSIONS   1. Left ventricular ejection fraction, by estimation, is 60 to 65%. Left  ventricular ejection fraction by 3D volume is 65 %. The left ventricle has  normal function. The left ventricle has no regional wall motion  abnormalities. Left ventricular diastolic   parameters are consistent with Grade I diastolic dysfunction (impaired  relaxation). The average left ventricular global longitudinal strain is  -17.8 %. The global longitudinal strain is normal.   2. Right ventricular systolic function is normal. The right ventricular  size is normal. There is normal pulmonary artery systolic pressure.  3. The mitral valve is normal in structure. Trivial mitral valve  regurgitation. No evidence of mitral stenosis.   4. The aortic valve is tricuspid. Aortic valve regurgitation is not  visualized. No aortic stenosis is present.   5. The inferior vena cava is normal in size with greater than 50%  respiratory variability, suggesting right atrial pressure of 3 mmHg.    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     BILATERAL FOOT SURGERY     BREAST BIOPSY Left 10/30/2022   MM LT BREAST BX W LOC DEV 1ST LESION IMAGE BX SPEC STEREO GUIDE 10/30/2022 [facility]   BREAST BIOPSY Right 10/30/2022   MM RT BREAST BX W LOC DEV 1ST LESION IMAGE BX SPEC STEREO GUIDE 10/30/2022 [facility]   BREAST EXCISIONAL BIOPSY Right    late 80's   BREAST EXCISIONAL BIOPSY Right    CHOLECYSTECTOMY N/A 09/11/2017   Procedure: LAPAROSCOPIC CHOLECYSTECTOMY;  Surgeon: Jerryl Morin, MD;  Location: [facility];  Service: General;  Laterality: N/A;   ERCP N/A 09/10/2017   Procedure: ENDOSCOPIC RETROGRADE CHOLANGIOPANCREATOGRAPHY (ERCP);  Surgeon: Kenney Peacemaker, MD;  Location: [facility] ENDOSCOPY;  Service: Endoscopy;  Laterality: N/A;   FEMUR IM NAIL Left 10/10/2013   Procedure: INTRAMEDULLARY (IM) RETROGRADE FEMORAL NAILING;  Surgeon: Amada Backer, MD;  Location: [facility];  Service: Orthopedics;  Laterality: Left;   TUBAL LIGATION      MEDICATIONS:  albuterol (VENTOLIN HFA) 108 (90 Base) MCG/ACT inhaler   ALPRAZolam (XANAX) 1 MG tablet   amLODipine (NORVASC) 5 MG tablet   Cholecalciferol (VITAMIN D3 PO)   famotidine (PEPCID) 20 MG tablet   levothyroxine (SYNTHROID) 112 MCG tablet   meloxicam (MOBIC) 15 MG tablet   oxyCODONE-acetaminophen (PERCOCET) 10-325 MG tablet   rosuvastatin (CRESTOR) 40 MG tablet   sertraline (ZOLOFT) 50 MG tablet   spironolactone-hydrochlorothiazide (ALDACTAZIDE) 25-25 MG per tablet   No current facility-administered medications for this encounter.    Ella Gun,  PA-C Surgical Short Stay/Anesthesiology [facility] Phone [phone] [facility] Phone [phone] 01/08/2024 4:02 PM

## 2024-01-15 ENCOUNTER — Encounter (HOSPITAL_COMMUNITY): Payer: Self-pay | Admitting: Neurological Surgery

## 2024-01-15 ENCOUNTER — Observation Stay (HOSPITAL_COMMUNITY)
Admission: RE | Admit: 2024-01-15 | Discharge: 2024-01-17 | Disposition: A | Discharge status: Home / Self Care | Attending: Neurological Surgery | Admitting: Neurological Surgery

## 2024-01-15 ENCOUNTER — Ambulatory Visit (HOSPITAL_COMMUNITY)

## 2024-01-15 ENCOUNTER — Ambulatory Visit (HOSPITAL_BASED_OUTPATIENT_CLINIC_OR_DEPARTMENT_OTHER)

## 2024-01-15 ENCOUNTER — Other Ambulatory Visit: Payer: Self-pay

## 2024-01-15 ENCOUNTER — Encounter (HOSPITAL_COMMUNITY)
Admission: RE | Disposition: A | Discharge status: Home / Self Care | Payer: Self-pay | Source: Home / Self Care | Attending: Neurological Surgery

## 2024-01-15 ENCOUNTER — Ambulatory Visit (HOSPITAL_COMMUNITY): Admitting: Vascular Surgery

## 2024-01-15 DIAGNOSIS — M48062 Spinal stenosis, lumbar region with neurogenic claudication: Secondary | ICD-10-CM | POA: Insufficient documentation

## 2024-01-15 DIAGNOSIS — Z79899 Other long term (current) drug therapy: Secondary | ICD-10-CM | POA: Insufficient documentation

## 2024-01-15 DIAGNOSIS — I10 Essential (primary) hypertension: Secondary | ICD-10-CM

## 2024-01-15 DIAGNOSIS — J449 Chronic obstructive pulmonary disease, unspecified: Secondary | ICD-10-CM | POA: Diagnosis not present

## 2024-01-15 DIAGNOSIS — M4316 Spondylolisthesis, lumbar region: Principal | ICD-10-CM | POA: Insufficient documentation

## 2024-01-15 DIAGNOSIS — E039 Hypothyroidism, unspecified: Secondary | ICD-10-CM | POA: Insufficient documentation

## 2024-01-15 DIAGNOSIS — M5416 Radiculopathy, lumbar region: Secondary | ICD-10-CM | POA: Insufficient documentation

## 2024-01-15 DIAGNOSIS — F17210 Nicotine dependence, cigarettes, uncomplicated: Secondary | ICD-10-CM | POA: Insufficient documentation

## 2024-01-15 LAB — ABO/RH: ABO/RH(D): O POS

## 2024-01-15 SURGERY — POSTERIOR LUMBAR FUSION 1 LEVEL
Anesthesia: General | Site: Back

## 2024-01-15 MED ORDER — SODIUM CHLORIDE 0.9 % IV SOLN: INTRAVENOUS | Status: DC | PRN

## 2024-01-15 MED ORDER — SERTRALINE HCL 50 MG PO TABS
50.0000 mg | ORAL_TABLET | Freq: Every morning | ORAL | Status: DC
  Administered 2024-01-16 – 2024-01-17 (×2): 50 mg via ORAL
  Filled 2024-01-15 (×2): qty 1

## 2024-01-15 MED ORDER — LEVOTHYROXINE SODIUM 100 MCG PO TABS
100.0000 ug | ORAL_TABLET | Freq: Every day | ORAL | Status: DC
  Administered 2024-01-16 – 2024-01-17 (×2): 100 ug via ORAL
  Filled 2024-01-15 (×2): qty 1

## 2024-01-15 MED ORDER — SODIUM CHLORIDE 0.9% FLUSH: 3.0000 mL | INTRAVENOUS | Status: DC | PRN

## 2024-01-15 MED ORDER — LIDOCAINE 2% (20 MG/ML) 5 ML SYRINGE
INTRAMUSCULAR | Status: AC
  Filled 2024-01-15: qty 5

## 2024-01-15 MED ORDER — ONDANSETRON HCL 4 MG/2ML IJ SOLN
INTRAMUSCULAR | Status: DC | PRN
  Administered 2024-01-15: 4 mg via INTRAVENOUS

## 2024-01-15 MED ORDER — CEFAZOLIN SODIUM-DEXTROSE 2-4 GM/100ML-% IV SOLN
2.0000 g | Freq: Three times a day (TID) | INTRAVENOUS | Status: AC
  Administered 2024-01-15 (×2): 2 g via INTRAVENOUS
  Filled 2024-01-15 (×2): qty 100

## 2024-01-15 MED ORDER — PHENYLEPHRINE 80 MCG/ML (10ML) SYRINGE FOR IV PUSH (FOR BLOOD PRESSURE SUPPORT)
PREFILLED_SYRINGE | INTRAVENOUS | Status: AC
  Filled 2024-01-15: qty 10

## 2024-01-15 MED ORDER — ALPRAZOLAM 0.5 MG PO TABS
2.0000 mg | ORAL_TABLET | Freq: Two times a day (BID) | ORAL | Status: DC | PRN
  Administered 2024-01-15: 2 mg via ORAL
  Administered 2024-01-16: 1 mg via ORAL
  Filled 2024-01-15: qty 4

## 2024-01-15 MED ORDER — CEFAZOLIN SODIUM-DEXTROSE 2-4 GM/100ML-% IV SOLN
2.0000 g | INTRAVENOUS | Status: AC
  Administered 2024-01-15: 2 g via INTRAVENOUS

## 2024-01-15 MED ORDER — ALBUTEROL SULFATE (2.5 MG/3ML) 0.083% IN NEBU: 3.0000 mL | INHALATION_SOLUTION | RESPIRATORY_TRACT | Status: DC | PRN

## 2024-01-15 MED ORDER — HYDROMORPHONE HCL 1 MG/ML IJ SOLN: 1.0000 mg | INTRAMUSCULAR | Status: DC | PRN

## 2024-01-15 MED ORDER — DEXAMETHASONE SODIUM PHOSPHATE 10 MG/ML IJ SOLN
INTRAMUSCULAR | Status: AC
  Filled 2024-01-15: qty 1

## 2024-01-15 MED ORDER — PROPOFOL 10 MG/ML IV BOLUS
INTRAVENOUS | Status: AC
  Filled 2024-01-15: qty 20

## 2024-01-15 MED ORDER — FAMOTIDINE 20 MG PO TABS
20.0000 mg | ORAL_TABLET | Freq: Every morning | ORAL | Status: DC
  Administered 2024-01-16 – 2024-01-17 (×2): 20 mg via ORAL
  Filled 2024-01-15 (×2): qty 1

## 2024-01-15 MED ORDER — FENTANYL CITRATE (PF) 250 MCG/5ML IJ SOLN
INTRAMUSCULAR | Status: DC | PRN
  Administered 2024-01-15 (×5): 50 ug via INTRAVENOUS

## 2024-01-15 MED ORDER — 0.9 % SODIUM CHLORIDE (POUR BTL) OPTIME
TOPICAL | Status: DC | PRN
  Administered 2024-01-15: 1000 mL

## 2024-01-15 MED ORDER — LIDOCAINE-EPINEPHRINE 1 %-1:100000 IJ SOLN
INTRAMUSCULAR | Status: AC
  Filled 2024-01-15: qty 1

## 2024-01-15 MED ORDER — ONDANSETRON HCL 4 MG/2ML IJ SOLN: 4.0000 mg | Freq: Four times a day (QID) | INTRAMUSCULAR | Status: DC | PRN

## 2024-01-15 MED ORDER — BUPIVACAINE HCL (PF) 0.5 % IJ SOLN
INTRAMUSCULAR | Status: DC | PRN
  Administered 2024-01-15: 25 mL

## 2024-01-15 MED ORDER — POLYETHYLENE GLYCOL 3350 17 G PO PACK: 17.0000 g | PACK | Freq: Every day | ORAL | Status: DC | PRN

## 2024-01-15 MED ORDER — EPHEDRINE 5 MG/ML INJ
INTRAVENOUS | Status: AC
  Filled 2024-01-15: qty 5

## 2024-01-15 MED ORDER — OXYCODONE HCL 5 MG PO TABS: 5.0000 mg | ORAL_TABLET | Freq: Once | ORAL | Status: DC | PRN

## 2024-01-15 MED ORDER — CHLORHEXIDINE GLUCONATE CLOTH 2 % EX PADS: 6.0000 | MEDICATED_PAD | Freq: Once | CUTANEOUS | Status: DC

## 2024-01-15 MED ORDER — LACTATED RINGERS IV SOLN: INTRAVENOUS | Status: DC

## 2024-01-15 MED ORDER — ACETAMINOPHEN 650 MG RE SUPP: 650.0000 mg | RECTAL | Status: DC | PRN

## 2024-01-15 MED ORDER — MENTHOL 3 MG MT LOZG: 1.0000 | LOZENGE | OROMUCOSAL | Status: DC | PRN

## 2024-01-15 MED ORDER — SUGAMMADEX SODIUM 200 MG/2ML IV SOLN
INTRAVENOUS | Status: DC | PRN
  Administered 2024-01-15: 200 mg via INTRAVENOUS

## 2024-01-15 MED ORDER — KETOROLAC TROMETHAMINE 0.5 % OP SOLN
1.0000 [drp] | Freq: Four times a day (QID) | OPHTHALMIC | Status: DC
  Administered 2024-01-15: 1 [drp] via OPHTHALMIC
  Filled 2024-01-15: qty 5

## 2024-01-15 MED ORDER — DEXAMETHASONE SODIUM PHOSPHATE 10 MG/ML IJ SOLN
INTRAMUSCULAR | Status: DC | PRN
  Administered 2024-01-15: 10 mg via INTRAVENOUS

## 2024-01-15 MED ORDER — FENTANYL CITRATE (PF) 100 MCG/2ML IJ SOLN
25.0000 ug | INTRAMUSCULAR | Status: DC | PRN
  Administered 2024-01-15 (×3): 50 ug via INTRAVENOUS

## 2024-01-15 MED ORDER — METHOCARBAMOL 1000 MG/10ML IJ SOLN: 500.0000 mg | Freq: Four times a day (QID) | INTRAMUSCULAR | Status: DC | PRN

## 2024-01-15 MED ORDER — MIDAZOLAM HCL 2 MG/2ML IJ SOLN
INTRAMUSCULAR | Status: AC
  Filled 2024-01-15: qty 2

## 2024-01-15 MED ORDER — ALUM & MAG HYDROXIDE-SIMETH 200-200-20 MG/5ML PO SUSP: 30.0000 mL | Freq: Four times a day (QID) | ORAL | Status: DC | PRN

## 2024-01-15 MED ORDER — BISACODYL 10 MG RE SUPP: 10.0000 mg | Freq: Every day | RECTAL | Status: DC | PRN

## 2024-01-15 MED ORDER — THROMBIN 5000 UNITS EX SOLR: OROMUCOSAL | Status: DC | PRN

## 2024-01-15 MED ORDER — OXYCODONE HCL 5 MG/5ML PO SOLN: 5.0000 mg | Freq: Once | ORAL | Status: DC | PRN

## 2024-01-15 MED ORDER — PHENOL 1.4 % MT LIQD: 1.0000 | OROMUCOSAL | Status: DC | PRN

## 2024-01-15 MED ORDER — ROSUVASTATIN CALCIUM 20 MG PO TABS
40.0000 mg | ORAL_TABLET | Freq: Every day | ORAL | Status: DC
  Administered 2024-01-15 – 2024-01-16 (×2): 40 mg via ORAL
  Filled 2024-01-15 (×2): qty 2

## 2024-01-15 MED ORDER — ORAL CARE MOUTH RINSE: 15.0000 mL | Freq: Once | OROMUCOSAL | Status: AC

## 2024-01-15 MED ORDER — PHENYLEPHRINE HCL-NACL 20-0.9 MG/250ML-% IV SOLN
INTRAVENOUS | Status: AC
  Filled 2024-01-15: qty 500

## 2024-01-15 MED ORDER — FENTANYL CITRATE (PF) 100 MCG/2ML IJ SOLN
INTRAMUSCULAR | Status: AC
  Filled 2024-01-15: qty 2

## 2024-01-15 MED ORDER — SENNA 8.6 MG PO TABS
1.0000 | ORAL_TABLET | Freq: Two times a day (BID) | ORAL | Status: DC
  Administered 2024-01-15 – 2024-01-16 (×2): 8.6 mg via ORAL
  Filled 2024-01-15 (×4): qty 1

## 2024-01-15 MED ORDER — DOCUSATE SODIUM 100 MG PO CAPS
100.0000 mg | ORAL_CAPSULE | Freq: Two times a day (BID) | ORAL | Status: DC
  Administered 2024-01-15 – 2024-01-16 (×2): 100 mg via ORAL
  Filled 2024-01-15 (×4): qty 1

## 2024-01-15 MED ORDER — CEFAZOLIN SODIUM-DEXTROSE 2-4 GM/100ML-% IV SOLN
INTRAVENOUS | Status: AC
  Filled 2024-01-15: qty 100

## 2024-01-15 MED ORDER — SODIUM CHLORIDE 0.9 % IV SOLN: 250.0000 mL | INTRAVENOUS | Status: AC

## 2024-01-15 MED ORDER — OXYCODONE-ACETAMINOPHEN 10-325 MG PO TABS: 1.0000 | ORAL_TABLET | ORAL | Status: DC | PRN

## 2024-01-15 MED ORDER — LIDOCAINE 2% (20 MG/ML) 5 ML SYRINGE
INTRAMUSCULAR | Status: DC | PRN
  Administered 2024-01-15: 80 mg via INTRAVENOUS

## 2024-01-15 MED ORDER — SODIUM CHLORIDE 0.9% FLUSH
3.0000 mL | Freq: Two times a day (BID) | INTRAVENOUS | Status: DC
  Administered 2024-01-15: 3 mL via INTRAVENOUS

## 2024-01-15 MED ORDER — CHLORHEXIDINE GLUCONATE 0.12 % MT SOLN
15.0000 mL | Freq: Once | OROMUCOSAL | Status: AC
  Administered 2024-01-15: 15 mL via OROMUCOSAL
  Filled 2024-01-15: qty 15

## 2024-01-15 MED ORDER — OXYCODONE HCL 5 MG PO TABS
5.0000 mg | ORAL_TABLET | ORAL | Status: DC | PRN
  Administered 2024-01-15 – 2024-01-17 (×8): 5 mg via ORAL
  Filled 2024-01-15 (×8): qty 1

## 2024-01-15 MED ORDER — ROCURONIUM BROMIDE 10 MG/ML (PF) SYRINGE
PREFILLED_SYRINGE | INTRAVENOUS | Status: DC | PRN
  Administered 2024-01-15: 5 mg via INTRAVENOUS
  Administered 2024-01-15: 10 mg via INTRAVENOUS
  Administered 2024-01-15: 60 mg via INTRAVENOUS

## 2024-01-15 MED ORDER — ACETAMINOPHEN 325 MG PO TABS: 650.0000 mg | ORAL_TABLET | ORAL | Status: DC | PRN

## 2024-01-15 MED ORDER — SPIRONOLACTONE-HCTZ 25-25 MG PO TABS
1.0000 | ORAL_TABLET | Freq: Every morning | ORAL | Status: DC
Start: 2024-01-16 — End: 2024-01-16
  Filled 2024-01-15 (×2): qty 1

## 2024-01-15 MED ORDER — METHOCARBAMOL 500 MG PO TABS
500.0000 mg | ORAL_TABLET | Freq: Four times a day (QID) | ORAL | Status: DC | PRN
  Administered 2024-01-15 – 2024-01-16 (×3): 500 mg via ORAL
  Filled 2024-01-15 (×3): qty 1

## 2024-01-15 MED ORDER — AMLODIPINE BESYLATE 5 MG PO TABS
5.0000 mg | ORAL_TABLET | Freq: Every morning | ORAL | Status: DC
  Administered 2024-01-16: 5 mg via ORAL
  Filled 2024-01-15: qty 1

## 2024-01-15 MED ORDER — MIDAZOLAM HCL 2 MG/2ML IJ SOLN
INTRAMUSCULAR | Status: DC | PRN
  Administered 2024-01-15: 2 mg via INTRAVENOUS

## 2024-01-15 MED ORDER — LIDOCAINE-EPINEPHRINE 1 %-1:100000 IJ SOLN
INTRAMUSCULAR | Status: DC | PRN
  Administered 2024-01-15: 10 mL

## 2024-01-15 MED ORDER — OXYCODONE-ACETAMINOPHEN 5-325 MG PO TABS
1.0000 | ORAL_TABLET | ORAL | Status: DC | PRN
  Administered 2024-01-15 – 2024-01-17 (×8): 1 via ORAL
  Filled 2024-01-15 (×8): qty 1

## 2024-01-15 MED ORDER — FENTANYL CITRATE (PF) 250 MCG/5ML IJ SOLN
INTRAMUSCULAR | Status: AC
  Filled 2024-01-15: qty 5

## 2024-01-15 MED ORDER — ONDANSETRON HCL 4 MG PO TABS: 4.0000 mg | ORAL_TABLET | Freq: Four times a day (QID) | ORAL | Status: DC | PRN

## 2024-01-15 MED ORDER — THROMBIN 5000 UNITS EX KIT
PACK | CUTANEOUS | Status: AC
  Filled 2024-01-15: qty 1

## 2024-01-15 MED ORDER — BUPIVACAINE HCL (PF) 0.5 % IJ SOLN
INTRAMUSCULAR | Status: AC
  Filled 2024-01-15: qty 30

## 2024-01-15 MED ORDER — ROCURONIUM BROMIDE 10 MG/ML (PF) SYRINGE
PREFILLED_SYRINGE | INTRAVENOUS | Status: AC
  Filled 2024-01-15: qty 10

## 2024-01-15 MED ORDER — FLEET ENEMA RE ENEM: 1.0000 | ENEMA | Freq: Once | RECTAL | Status: DC | PRN

## 2024-01-15 MED ORDER — DEXMEDETOMIDINE HCL IN NACL 80 MCG/20ML IV SOLN
INTRAVENOUS | Status: AC
  Filled 2024-01-15: qty 20

## 2024-01-15 MED ORDER — KETOROLAC TROMETHAMINE 15 MG/ML IJ SOLN
7.5000 mg | Freq: Four times a day (QID) | INTRAMUSCULAR | Status: AC
  Administered 2024-01-15 – 2024-01-16 (×4): 7.5 mg via INTRAVENOUS
  Filled 2024-01-15 (×4): qty 1

## 2024-01-15 MED ORDER — PROPOFOL 10 MG/ML IV BOLUS
INTRAVENOUS | Status: DC | PRN
  Administered 2024-01-15: 100 mg via INTRAVENOUS

## 2024-01-15 MED ORDER — ONDANSETRON HCL 4 MG/2ML IJ SOLN
INTRAMUSCULAR | Status: AC
  Filled 2024-01-15: qty 2

## 2024-01-15 MED ORDER — PHENYLEPHRINE HCL-NACL 20-0.9 MG/250ML-% IV SOLN
INTRAVENOUS | Status: DC | PRN
  Administered 2024-01-15: 30 ug/min via INTRAVENOUS

## 2024-01-15 SURGICAL SUPPLY — 56 items
BAG COUNTER SPONGE SURGICOUNT (BAG) ×2 IMPLANT
BASKET BONE COLLECTION (BASKET) ×2 IMPLANT
BLADE BONE MILL MEDIUM (MISCELLANEOUS) ×2 IMPLANT
BLADE CLIPPER SURG (BLADE) IMPLANT
BUR MATCHSTICK NEURO 3.0 LAGG (BURR) ×2 IMPLANT
CAGE MAS PLIF 9X9X23-8 LUMBAR (Cage) IMPLANT
CANISTER SUCT 3000ML PPV (MISCELLANEOUS) ×2 IMPLANT
CHIPS CANC BONE 20CC PCAN1/4 (Bone Implant) ×1 IMPLANT
CNTNR URN SCR LID CUP LEK RST (MISCELLANEOUS) ×2 IMPLANT
COVER BACK TABLE 60X90IN (DRAPES) ×2 IMPLANT
DERMABOND ADVANCED .7 DNX12 (GAUZE/BANDAGES/DRESSINGS) ×2 IMPLANT
DEVICE DISSECT PLASMABLAD 3.0S (MISCELLANEOUS) ×2 IMPLANT
DRAPE C-ARM 42X72 X-RAY (DRAPES) ×4 IMPLANT
DRAPE HALF SHEET 40X57 (DRAPES) IMPLANT
DRAPE LAPAROTOMY 100X72X124 (DRAPES) ×2 IMPLANT
DRSG OPSITE POSTOP 4X6 (GAUZE/BANDAGES/DRESSINGS) IMPLANT
DURAPREP 26ML APPLICATOR (WOUND CARE) ×2 IMPLANT
ELECTRODE REM PT RTRN 9FT ADLT (ELECTROSURGICAL) ×2 IMPLANT
GAUZE 4X4 16PLY ~~LOC~~+RFID DBL (SPONGE) IMPLANT
GAUZE SPONGE 4X4 12PLY STRL (GAUZE/BANDAGES/DRESSINGS) ×2 IMPLANT
GLOVE BIOGEL PI IND STRL 8.5 (GLOVE) ×4 IMPLANT
GLOVE ECLIPSE 8.5 STRL (GLOVE) ×4 IMPLANT
GOWN STRL REUS W/ TWL LRG LVL3 (GOWN DISPOSABLE) IMPLANT
GOWN STRL REUS W/ TWL XL LVL3 (GOWN DISPOSABLE) IMPLANT
GOWN STRL REUS W/TWL 2XL LVL3 (GOWN DISPOSABLE) ×4 IMPLANT
GRAFT BNE CANC CHIPS 1-8 20CC (Bone Implant) IMPLANT
GRAFT BONE PROTEIOS MED 2.5CC (Orthopedic Implant) IMPLANT
HEMOSTAT POWDER KIT SURGIFOAM (HEMOSTASIS) ×2 IMPLANT
KIT BASIN OR (CUSTOM PROCEDURE TRAY) ×2 IMPLANT
KIT TURNOVER KIT B (KITS) ×2 IMPLANT
MILL BONE PREP (MISCELLANEOUS) ×2 IMPLANT
NDL HYPO 22X1.5 SAFETY MO (MISCELLANEOUS) ×2 IMPLANT
NEEDLE HYPO 22X1.5 SAFETY MO (MISCELLANEOUS) ×1 IMPLANT
NS IRRIG 1000ML POUR BTL (IV SOLUTION) ×2 IMPLANT
PACK LAMINECTOMY NEURO (CUSTOM PROCEDURE TRAY) ×2 IMPLANT
PAD ARMBOARD POSITIONER FOAM (MISCELLANEOUS) ×6 IMPLANT
PATTIES SURGICAL .5 X.5 (GAUZE/BANDAGES/DRESSINGS) ×2 IMPLANT
PATTIES SURGICAL .5 X1 (DISPOSABLE) ×2 IMPLANT
PATTIES SURGICAL 1X1 (DISPOSABLE) ×2 IMPLANT
ROD RELINE-O LORD 5.5X35MM (Rod) IMPLANT
SCREW LOCK RELINE 5.5 TULIP (Screw) IMPLANT
SCREW RELINE 2FS POLY 6.5X45 (Screw) IMPLANT
SCREW RELINE-O POLY 6.5X45 (Screw) IMPLANT
SPIKE FLUID TRANSFER (MISCELLANEOUS) ×2 IMPLANT
SPONGE T-LAP 4X18 ~~LOC~~+RFID (SPONGE) IMPLANT
SUT PROLENE 6 0 BV (SUTURE) IMPLANT
SUT VIC AB 1 CT1 18XBRD ANBCTR (SUTURE) ×2 IMPLANT
SUT VIC AB 2-0 CP2 18 (SUTURE) ×2 IMPLANT
SUT VIC AB 3-0 SH 8-18 (SUTURE) ×2 IMPLANT
SUT VIC AB 4-0 RB1 18 (SUTURE) ×2 IMPLANT
SYR 30ML SLIP (SYRINGE) ×2 IMPLANT
SYR 3ML LL SCALE MARK (SYRINGE) ×8 IMPLANT
TOWEL GREEN STERILE (TOWEL DISPOSABLE) ×2 IMPLANT
TOWEL GREEN STERILE FF (TOWEL DISPOSABLE) ×2 IMPLANT
TRAY FOLEY MTR SLVR 16FR STAT (SET/KITS/TRAYS/PACK) ×2 IMPLANT
WATER STERILE IRR 1000ML POUR (IV SOLUTION) ×2 IMPLANT

## 2024-01-15 NOTE — Anesthesia Procedure Notes (Addendum)
 Procedure Name: Intubation Date/Time: 01/15/2024 8:11 AM  Performed by: Candance Certain, CRNAPre-anesthesia Checklist: Patient identified, Emergency Drugs available, Suction available and Patient being monitored Patient Re-evaluated:Patient Re-evaluated prior to induction Oxygen Delivery Method: Circle System Utilized Preoxygenation: Pre-oxygenation with 100% oxygen Induction Type: IV induction Ventilation: Mask ventilation without difficulty Laryngoscope Size: Mac and 3 Grade View: Grade I Tube type: Oral Tube size: 7.0 mm Number of attempts: 1 Airway Equipment and Method: Stylet and Oral airway Placement Confirmation: ETT inserted through vocal cords under direct vision, positive ETCO2 and breath sounds checked- equal and bilateral Secured at: 21 cm Tube secured with: Tape Dental Injury: Teeth and Oropharynx as per pre-operative assessment  Comments: Atraumatic induction/intubation. Patient missing lower front teeth at baseline. Dentition and oral mucosa as per preop.

## 2024-01-15 NOTE — Op Note (Signed)
 Date of surgery: 01/15/2024 Preoperative diagnosis: Spondylolisthesis L3-L4 with stenosis lumbar radiculopathy Postoperative diagnosis: Same Procedure: Bilateral laminectomy L3-L4 with decompression of the L3 and L4 nerve roots with more work than required for simple interbody technique.  Posterior lumbar interbody arthrodesis with peek spacers local autograft allograft and Proteus.  Posterolateral arthrodesis with local autograft allograft and Proteus.  Pedicle screw fixation L3-L4.  Fluoroscopic visualization. Surgeon: Elna Haggis Anesthesia: General endotracheal Indications: Brooke Mccarthy is a 68 year old individual whose had significant back bilateral lower extremity pain she has a profound stenosis at the level of L3-L4 and having failed all manner of conservative management she was advised regarding surgical decompression and stabilization.  Procedure: The patient was brought to the operating room supine on the stretcher.  After the smooth induction of general endotracheal anesthesia and placement of a Foley catheter she was carefully turned prone.  The back was prepped with alcohol DuraPrep and draped in a sterile fashion.  Localizing radiograph was performed to obtain imaging of the L3-L4 space.  When verified the lumbodorsal fascia was opened on either side of the midline and dissected down to expose the interlaminar space at L3 and L4.  A wide dissection was performed in this region.  Then a high-speed drill was used to remove the inferior margin lamina of L3 out to and including the entirety of the facet at L3-4.  This was done bilaterally.  The redundant overgrown soft tissue in this region was taken up and the L ligament was resected.  This exposed the common dural tube and inferiorly the L4 nerve root.  Superiorly the L3 nerve root was uncovered and its travel out into the foramen.  A wide decompression was performed.  Once the nerve roots were isolated and the dura was secured medial  retraction was used and the disc space was opened with a #15 blade.  Combination of curettes and rongeurs in addition to a disc shaver was used to evacuate a substantial quantity of severely degenerated and desiccated disc material from the L3-4 space.  Then using a disc space spreader on one side the opposite side was opened.  By working from side-to-side we were able to evacuate all the disc material and clear the endplates of any cartilaginous attachments.  Once this was verified and enters body spacer was sized and was felt that a 9 mm tall 8 degree lordotic spacer measuring 23 mm in length would fit best to the spacers were filled with autograft allograft and Proteus.  12 cc of bone graft was packed into the interspace along with the 2 spacers.  Then the lateral gutters were cleared with the intertransverse space between L3 and L4 being cleared an additional 3 cc of bone graft was packed into each lateral gutter.  Pedicle entry sites were then chosen at L3 and L4 using fluoroscopic guidance.  6.5 x 45 mm pedicle screws were placed into the pedicles at L3 and L4.  Precontoured 35 mm rods were used to connect the pedicle screws together.  Once the construct was completely tightened final radiographs were obtained in AP and lateral projection.  They identified good reduction of the spondylolisthesis good distraction of the space and open foramen at L3-4 and by palpation each of the nerve roots was free and clear.  With this then the retractors were removed hemostasis in the soft tissues was obtained meticulously 25 cc of half percent Marcaine  was injected into the paraspinous fascia and the fascia was then closed with #1 Vicryl in  interrupted fashion 2-0 Vicryl in the subcutaneous tissues 3-0 Vicryl subcuticularly.  Dermabond was placed on the skin.  Blood loss for the procedure was estimated 200 cc.

## 2024-01-15 NOTE — Progress Notes (Signed)
 Orthopedic Tech Progress Note Patient Details:  Brooke Mccarthy [birth year] [id number]  Ortho Devices Type of Ortho Device: Lumbar corsett Ortho Device/Splint Location: BACK Ortho Device/Splint Interventions: Ordered   Post Interventions Patient Tolerated: Well Instructions Provided: Care of device  [name] 01/15/2024, 1:10 PM

## 2024-01-15 NOTE — Transfer of Care (Signed)
 Immediate Anesthesia Transfer of Care Note  Patient: Brooke Mccarthy  Procedure(s) Performed: POSTERIOR LUMBAR INTERBODY FUSION LUMBAR THREE- LUMBAR FOUR (Back)  Patient Location: PACU  Anesthesia Type:General  Level of Consciousness: awake and alert   Airway & Oxygen Therapy: Patient Spontanous Breathing and Patient connected to face mask oxygen  Post-op Assessment: Report given to RN and Post -op Vital signs reviewed and stable  Post vital signs: Reviewed and stable  Last Vitals:  Vitals Value Taken Time  BP 121/78 01/15/24 1123  Temp 37.1 C 01/15/24 1123  Pulse 81 01/15/24 1129  Resp 13 01/15/24 1129  SpO2 89 % 01/15/24 1129  Vitals shown include unfiled device data.  Last Pain:  Vitals:   01/15/24 1123  TempSrc:   PainSc: Asleep         Complications: No notable events documented.

## 2024-01-15 NOTE — H&P (Signed)
 Brooke Mccarthy is an 68 y.o. female.   Chief Complaint: Back pain and bilateral leg pain HPI: Brooke Mccarthy is a 68 year old individual who years ago had a herniated nucleus pulposus at L4-L5.  She had surgery that relieved the pain but she has had chronic back pain for years now she has had increased difficulty walking and was noted to have a high-grade stenosis with a degenerative spondylolisthesis at the level of L3-L4.  Having failed efforts at conservative management we discussed surgical intervention.  Brooke Mccarthy has a significant past medical history of COPD secondary to longstanding smoking history.  She had a recent exacerbation several months ago but is over this now.  She has had medical clearance.  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     BILATERAL FOOT SURGERY     BREAST BIOPSY Left 10/30/2022   MM LT BREAST BX W LOC DEV 1ST LESION IMAGE BX SPEC STEREO GUIDE 10/30/2022 [facility]   BREAST BIOPSY Right 10/30/2022   MM RT BREAST BX W LOC DEV 1ST LESION IMAGE BX SPEC STEREO GUIDE 10/30/2022 [facility]   BREAST EXCISIONAL BIOPSY Right    late 80's   BREAST EXCISIONAL BIOPSY Right    CHOLECYSTECTOMY N/A 09/11/2017   Procedure: LAPAROSCOPIC CHOLECYSTECTOMY;  Surgeon: Jerryl Morin, MD;  Location: [facility];  Service: General;  Laterality: N/A;   ERCP N/A 09/10/2017   Procedure: ENDOSCOPIC RETROGRADE CHOLANGIOPANCREATOGRAPHY (ERCP);  Surgeon: Kenney Peacemaker, MD;  [facility] ENDOSCOPY;  Service: Endoscopy;  Laterality: N/A;   FEMUR IM NAIL Left 10/10/2013   Procedure:  INTRAMEDULLARY (IM) RETROGRADE FEMORAL NAILING;  Surgeon: Amada Backer, MD;  Location: [facility];  Service: Orthopedics;  Laterality: Left;   TUBAL LIGATION      Family History  Problem Relation Age of Onset   Cancer Mother    Cancer Father    Breast cancer Neg Hx    Social History:  reports that she has been smoking cigarettes. She has a 43 pack-year smoking history. She has never used smokeless tobacco. She reports that she does not drink alcohol and does not use drugs.  Allergies:  Allergies  Allergen Reactions   Fluoxetine Itching   Hydrocodone-Acetaminophen  Other (See Comments)    Unknown   Ultram [Tramadol]     "makes my heart beat fast"    Medications Prior to Admission  Medication Sig Dispense Refill   albuterol  (VENTOLIN  HFA) 108 (90 Base) MCG/ACT inhaler Inhale 2 puffs into the lungs every 4 (four) hours as needed for wheezing or shortness of breath. 1 each 0   ALPRAZolam  (XANAX ) 1 MG tablet Take 2 mg by mouth 2 (two) times daily as needed for anxiety.     amLODipine  (NORVASC ) 5 MG tablet Take 5 mg by mouth in the morning.     Cholecalciferol (VITAMIN D3 PO) Take 1 tablet by mouth in the morning.     famotidine  (PEPCID ) 20 MG tablet Take 20 mg by mouth in the morning.  levothyroxine  (SYNTHROID ) 112 MCG tablet Take 100 mcg by mouth daily before breakfast.     meloxicam  (MOBIC ) 15 MG tablet Take 15 mg by mouth in the morning.     oxyCODONE -acetaminophen  (PERCOCET) 10-325 MG tablet Take 1 tablet by mouth every 4 (four) hours as needed for pain.     rosuvastatin  (CRESTOR ) 40 MG tablet Take 40 mg by mouth at bedtime.     sertraline  (ZOLOFT ) 50 MG tablet Take 50 mg by mouth in the morning.     spironolactone -hydrochlorothiazide  (ALDACTAZIDE ) 25-25 MG per tablet Take 1 tablet by mouth in the morning.      No results found for this or any previous visit (from the past 48 hours). No results found.  Review of Systems  Constitutional:  Positive for activity change.   Musculoskeletal:  Positive for back pain, gait problem and myalgias.  Neurological:  Positive for weakness.  All other systems reviewed and are negative.   Blood pressure 135/67, pulse 71, temperature 98.7 F (37.1 C), temperature source Oral, resp. rate 18, height 4\' 11"  (1.499 m), weight 76.7 kg, SpO2 92%. Physical Exam Constitutional:      Appearance: Normal appearance.  HENT:     Head: Normocephalic and atraumatic.     Right Ear: Tympanic membrane, ear canal and external ear normal.     Left Ear: Tympanic membrane, ear canal and external ear normal.     Nose: Nose normal.     Mouth/Throat:     Mouth: Mucous membranes are moist.     Pharynx: Oropharynx is clear.  Eyes:     Extraocular Movements: Extraocular movements intact.     Conjunctiva/sclera: Conjunctivae normal.     Pupils: Pupils are equal, round, and reactive to light.  Cardiovascular:     Rate and Rhythm: Normal rate and regular rhythm.     Pulses: Normal pulses.     Heart sounds: Normal heart sounds.  Pulmonary:     Effort: Pulmonary effort is normal.     Breath sounds: Normal breath sounds.  Abdominal:     General: Abdomen is flat. Bowel sounds are normal.     Palpations: Abdomen is soft.  Musculoskeletal:     Cervical back: Neck supple.     Comments: Positive straight leg raising at 30 degrees in either lower extremity Patrick's maneuver is negative bilaterally.  Skin:    General: Skin is warm and dry.     Capillary Refill: Capillary refill takes less than 2 seconds.  Neurological:     Mental Status: She is alert.     Comments: Cranial nerve examination is normal.  Upper extremity strength is intact with normal reflexes in the biceps and triceps in the lower extremities she has absent reflexes in the patellae and the Achilles and there is mild weakness in the iliopsoas and quadriceps at 4 out of 5.  Tibialis anterior strength is 4 out of 5 gastroc strength is 5 out of 5.  Tone and bulk are intact.  Gait  reveals a forward base stoop about 10 degrees.  She has difficulty standing more than just 2 to 3 minutes without having significant increase in back pain.  Psychiatric:        Mood and Affect: Mood normal.        Behavior: Behavior normal.        Thought Content: Thought content normal.        Judgment: Judgment normal.      Assessment/Plan 1 low listhesis and stenosis L3-L4.  Plan posterior lumbar interbody arthrodesis with decompression and fusion L3-L4.  Sela Daft, MD 01/15/2024, 7:39 AM

## 2024-01-16 DIAGNOSIS — M4316 Spondylolisthesis, lumbar region: Secondary | ICD-10-CM | POA: Diagnosis not present

## 2024-01-16 MED ORDER — DEXAMETHASONE 4 MG PO TABS
2.0000 mg | ORAL_TABLET | Freq: Two times a day (BID) | ORAL | Status: DC
  Administered 2024-01-16 – 2024-01-17 (×3): 2 mg via ORAL
  Filled 2024-01-16 (×3): qty 1

## 2024-01-16 MED ORDER — HYDROCHLOROTHIAZIDE 25 MG PO TABS
25.0000 mg | ORAL_TABLET | Freq: Every day | ORAL | Status: DC
  Administered 2024-01-16: 25 mg via ORAL
  Filled 2024-01-16: qty 1

## 2024-01-16 MED ORDER — SPIRONOLACTONE 25 MG PO TABS
25.0000 mg | ORAL_TABLET | Freq: Every day | ORAL | Status: DC
Start: 2024-01-16 — End: 2024-01-17
  Administered 2024-01-16: 25 mg via ORAL
  Filled 2024-01-16: qty 1

## 2024-01-16 NOTE — Anesthesia Postprocedure Evaluation (Signed)
 Anesthesia Post Note  Patient: Brooke Mccarthy  Procedure(s) Performed: POSTERIOR LUMBAR INTERBODY FUSION LUMBAR THREE- LUMBAR FOUR (Back)     Patient location during evaluation: PACU Anesthesia Type: General Level of consciousness: awake and alert Pain management: pain level controlled Vital Signs Assessment: post-procedure vital signs reviewed and stable Respiratory status: spontaneous breathing, nonlabored ventilation and respiratory function stable Cardiovascular status: blood pressure returned to baseline and stable Postop Assessment: no apparent nausea or vomiting Anesthetic complications: no   No notable events documented.                Anasophia Pecor

## 2024-01-16 NOTE — Progress Notes (Signed)
 Patient ID: Brooke Mccarthy, female   DOB: 1956-03-01, 68 y.o.   MRN: [id number] Incision is clean and dry.  Patient is ambulatory.  Motor function appears intact.  Dressing is clean and dry.  She lives by herself with her dog but has no one to see her today.  Will plan discharge for the morning.

## 2024-01-16 NOTE — Evaluation (Signed)
 Occupational Therapy Evaluation Patient Details Name: Brooke Mccarthy MRN: [id number] DOB: 01/24/1956 Today's Date: 01/16/2024   History of Present Illness   Pt is a 68 y/o female who presents s/p L3-L4 PLIF on 01/15/2024. PMH significant for Anxiety, COPD, restless leg syndrome, HTN, hypothyroidism, prior back surgery, B foot surgery, L IM nail 2015.     Clinical Impressions PTA, pt lived alone mod I. Upon eval, pt performing UB ADL with mod I and LB ADL with mod I. Pt educated and demonstrating use of compensatory techniques for bed mobility, LB ADL, grooming, toileting, and shower transfers within precautions. Pt with concerns for caring for pet. Provided extensive education regarding doing so within spinal precautions. All education provided and questions answered. Recommending discharge home with no OT follow up at this time. OT to sign off. Please re-consult if change in status.       If plan is discharge home, recommend the following:   Other (comment) (on pt requets)     Functional Status Assessment   Patient has had a recent decline in their functional status and demonstrates the ability to make significant improvements in function in a reasonable and predictable amount of time.     Equipment Recommendations   BSC/3in1     Recommendations for Other Services         Precautions/Restrictions   Precautions Precautions: Fall;Back Precaution Booklet Issued: Yes (comment) Recall of Precautions/Restrictions: Intact Precaution/Restrictions Comments: Reviewed handout and pt was cued for precautions during ADL Required Braces or Orthoses: Spinal Brace Spinal Brace: Lumbar corset;Applied in sitting position Restrictions Weight Bearing Restrictions Per Provider Order: No     Mobility Bed Mobility               General bed mobility comments: Pt was received sitting up in the recliner    Transfers Overall transfer level: Modified independent Equipment  used: None                      Balance Overall balance assessment: Needs assistance Sitting-balance support: Feet supported, No upper extremity supported Sitting balance-Leahy Scale: Fair     Standing balance support: No upper extremity supported Standing balance-Leahy Scale: Good                             ADL either performed or assessed with clinical judgement   ADL Overall ADL's : Modified independent                                       General ADL Comments: for BADL, reviewed LB ADL, grooming, shower transfer within precautions.     Vision         Perception         Praxis         Pertinent Vitals/Pain Pain Assessment Pain Assessment: Faces Faces Pain Scale: Hurts a little bit Pain Location: Back/incision site Pain Descriptors / Indicators: Operative site guarding, Sore Pain Intervention(s): Monitored during session     Extremity/Trunk Assessment Upper Extremity Assessment Upper Extremity Assessment: Overall WFL for tasks assessed   Lower Extremity Assessment Lower Extremity Assessment: Defer to PT evaluation   Cervical / Trunk Assessment Cervical / Trunk Assessment: Back Surgery   Communication Communication Communication: No apparent difficulties   Cognition Arousal: Alert Behavior During Therapy: WFL for tasks assessed/performed Cognition: No apparent impairments  Following commands: Intact       Cueing  General Comments   Cueing Techniques: Verbal cues;Gestural cues      Exercises     Shoulder Instructions      Home Living Family/patient expects to be discharged to:: Private residence Living Arrangements: Alone Available Help at Discharge: Family;Available PRN/intermittently Type of Home: House Home Access: Stairs to enter [employer] of Steps: 4 Entrance Stairs-Rails: Right;Left;Can reach both Home Layout: One level     Bathroom  Shower/Tub: Tub/shower unit;Walk-in shower (walk in has large lip)   Bathroom Toilet: Standard     Home Equipment: None          Prior Functioning/Environment Prior Level of Function : Independent/Modified Independent                    OT Problem List: Decreased strength;Decreased activity tolerance;Impaired balance (sitting and/or standing)   OT Treatment/Interventions:        OT Goals(Current goals can be found in the care plan section)   Acute Rehab OT Goals Patient Stated Goal: go home OT Goal Formulation: With patient Time For Goal Achievement: 01/30/24   OT Frequency:       Co-evaluation              AM-PAC OT "6 Clicks" Daily Activity     Outcome Measure Help from another person eating meals?: None Help from another person taking care of personal grooming?: None Help from another person toileting, which includes using toliet, bedpan, or urinal?: None Help from another person bathing (including washing, rinsing, drying)?: None Help from another person to put on and taking off regular upper body clothing?: None Help from another person to put on and taking off regular lower body clothing?: None 6 Click Score: 24   End of Session Equipment Utilized During Treatment: Gait belt;Back brace Nurse Communication: Mobility status  Activity Tolerance: Patient tolerated treatment well Patient left: in bed  OT Visit Diagnosis: Unsteadiness on feet (R26.81);Muscle weakness (generalized) (M62.81)                Time: 1610-9604 OT Time Calculation (min): 32 min Charges:  OT General Charges $OT Visit: 1 Visit OT Evaluation $OT Eval Low Complexity: 1 Low OT Treatments $Self Care/Home Management : 8-22 mins  Karilyn Ouch, OTR/L [facility] Acute Rehabilitation Office: [phone]  Brooke Mccarthy 01/16/2024, 11:40 AM

## 2024-01-16 NOTE — Evaluation (Signed)
 Physical Therapy Evaluation and Discharge Patient Details Name: Brooke Mccarthy MRN: [id number] DOB: 1956/03/04 Today's Date: 01/16/2024  History of Present Illness  Pt is a 68 y/o female who presents s/p L3-L4 PLIF on 01/15/2024. PMH significant for Anxiety, COPD, restless leg syndrome, HTN, hypothyroidism, prior back surgery, B foot surgery, L IM nail 2015.  Clinical Impression  Patient evaluated by Physical Therapy with no further acute PT needs identified. All education has been completed and the patient has no further questions. Pt was able to demonstrate transfers and ambulation with gross modified independence and RW for support. Pt was educated on precautions, brace application/wearing schedule, appropriate activity progression, and car transfer. Noted pt ambulated the length of the unit without assist prior to PT session. Pt reports she is going home tomorrow as she does not have anyone to assist her today, however family member that will be taking her home tomorrow present in the room today. See below for any follow-up Physical Therapy or equipment needs. PT is signing off. Thank you for this referral.         If plan is discharge home, recommend the following: Assist for transportation   Can travel by private vehicle        Equipment Recommendations Rolling walker (2 wheels);BSC/3in1 (youth size walker)  Recommendations for Other Services       Functional Status Assessment Patient has had a recent decline in their functional status and demonstrates the ability to make significant improvements in function in a reasonable and predictable amount of time.     Precautions / Restrictions Precautions Precautions: Fall;Back Precaution Booklet Issued: Yes (comment) Recall of Precautions/Restrictions: Intact Precaution/Restrictions Comments: Reviewed handout and pt was cued for precautions during functional mobility. Required Braces or Orthoses: Spinal Brace Spinal Brace: Lumbar  corset;Applied in sitting position Restrictions Weight Bearing Restrictions Per Provider Order: No      Mobility  Bed Mobility               General bed mobility comments: Pt was received sitting up in the recliner    Transfers Overall transfer level: Modified independent Equipment used: None                    Ambulation/Gait Ambulation/Gait assistance: Modified independent (Device/Increase time) Gait Distance (Feet): 350 Feet Assistive device: Rolling walker (2 wheels) Gait Pattern/deviations: Step-through pattern, Decreased stride length Gait velocity: Decreased Gait velocity interpretation: 1.31 - 2.62 ft/sec, indicative of limited community ambulator   General Gait Details: VC's for improved posture, closer walker proximity and forward gaze. Able to improve with walker adjustment.  Stairs Stairs: Yes Stairs assistance: Modified independent (Device/Increase time) Stair Management: One rail Right, Step to pattern, Forwards Number of Stairs: 4 General stair comments: Pt demonstrated proper hand placement on seated surface for safety.  Wheelchair Mobility     Tilt Bed    Modified Rankin (Stroke Patients Only)       Balance Overall balance assessment: Needs assistance Sitting-balance support: Feet supported, No upper extremity supported Sitting balance-Leahy Scale: Fair                                       Pertinent Vitals/Pain Pain Assessment Pain Assessment: Faces Faces Pain Scale: Hurts a little bit Pain Location: Back/incision site Pain Descriptors / Indicators: Operative site guarding, Sore Pain Intervention(s): Limited activity within patient's tolerance, Monitored during session, Repositioned  Home Living Family/patient expects to be discharged to:: Private residence Living Arrangements: Alone Available Help at Discharge: Family;Available PRN/intermittently Type of Home: House Home Access: Stairs to  enter Entrance Stairs-Rails: Right;Left;Can reach both Entrance Stairs-Number of Steps: 4   Home Layout: One level Home Equipment: None      Prior Function Prior Level of Function : Independent/Modified Independent                     Extremity/Trunk Assessment   Upper Extremity Assessment Upper Extremity Assessment: Defer to OT evaluation    Lower Extremity Assessment Lower Extremity Assessment: Generalized weakness (Mild; consistent with pre-op diagnosis)    Cervical / Trunk Assessment Cervical / Trunk Assessment: Back Surgery  Communication   Communication Communication: No apparent difficulties    Cognition Arousal: Alert Behavior During Therapy: WFL for tasks assessed/performed   PT - Cognitive impairments: No apparent impairments                         Following commands: Intact       Cueing Cueing Techniques: Verbal cues, Gestural cues     General Comments      Exercises     Assessment/Plan    PT Assessment Patient does not need any further PT services  PT Problem List         PT Treatment Interventions      PT Goals (Current goals can be found in the Care Plan section)  Acute Rehab PT Goals Patient Stated Goal: Home tomorrow PT Goal Formulation: All assessment and education complete, DC therapy    Frequency       Co-evaluation               AM-PAC PT "6 Clicks" Mobility  Outcome Measure Help needed turning from your back to your side while in a flat bed without using bedrails?: None Help needed moving from lying on your back to sitting on the side of a flat bed without using bedrails?: None Help needed moving to and from a bed to a chair (including a wheelchair)?: None Help needed standing up from a chair using your arms (e.g., wheelchair or bedside chair)?: None Help needed to walk in hospital room?: None Help needed climbing 3-5 steps with a railing? : None 6 Click Score: 24    End of Session Equipment  Utilized During Treatment: Back brace Activity Tolerance: Patient tolerated treatment well Patient left: in chair;with call bell/phone within reach;with family/visitor present Nurse Communication: Mobility status PT Visit Diagnosis: Unsteadiness on feet (R26.81);Pain Pain - part of body:  (back)    Time: 1610-9604 PT Time Calculation (min) (ACUTE ONLY): 18 min   Charges:   PT Evaluation $PT Eval Low Complexity: 1 Low   PT General Charges $$ ACUTE PT VISIT: 1 Visit         Simone Dubois, PT, DPT Acute Rehabilitation Services Secure Chat Preferred Office: [phone]   Venus Ginsberg 01/16/2024, 11:20 AM

## 2024-01-16 NOTE — Care Management Obs Status (Signed)
 MEDICARE OBSERVATION STATUS NOTIFICATION   Patient Details  Name: Brooke Mccarthy MRN: [id number] Date of Birth: Jul 14, 1956   Medicare Observation Status Notification Given:  Yes    Felix Host 01/16/2024, 12:05 PM

## 2024-01-17 DIAGNOSIS — M4316 Spondylolisthesis, lumbar region: Secondary | ICD-10-CM | POA: Diagnosis not present

## 2024-01-17 MED ORDER — METHOCARBAMOL 500 MG PO TABS: 500.0000 mg | ORAL_TABLET | Freq: Four times a day (QID) | ORAL | 2 refills | Status: AC | PRN

## 2024-01-17 MED ORDER — DEXAMETHASONE 1 MG PO TABS: ORAL_TABLET | ORAL | 0 refills | Status: AC

## 2024-01-17 MED ORDER — OXYCODONE-ACETAMINOPHEN 10-325 MG PO TABS: 1.0000 | ORAL_TABLET | ORAL | 0 refills | Status: AC | PRN

## 2024-01-17 NOTE — Discharge Summary (Signed)
 Physician Discharge Summary  Patient ID: Brooke Mccarthy MRN: [id number] DOB/AGE: 02/25/1956 68 y.o.  Admit date: 01/15/2024 Discharge date: 01/17/2024  Admission Diagnoses: Spondylolisthesis of the lumbar spine L3-L4.  Lumbar radiculopathy.  Neurogenic claudication.  Discharge Diagnoses: Neurogenic claudication.  Lumbar radiculopathy.  Spondylolisthesis L3-L4. Principal Problem:   Spondylolisthesis of lumbar region   Discharged Condition: good  Hospital Course: Patient tolerated surgery well.  Consults: None  Significant Diagnostic Studies: None  Treatments: surgery: See op note  Discharge Exam: Blood pressure 120/64, pulse (!) 57, temperature 97.8 F (36.6 C), temperature source Oral, resp. rate 16, height 4\' 11"  (1.499 m), weight 76.7 kg, SpO2 97%. Station and gait are intact.  Incision is clean and dry.  Disposition: Discharge disposition: 01-Home or Self Care       Discharge Instructions     Call MD for:  redness, tenderness, or signs of infection (pain, swelling, redness, odor or green/yellow discharge around incision site)   Complete by: As directed    Call MD for:  severe uncontrolled pain   Complete by: As directed    Call MD for:  temperature >100.4   Complete by: As directed    Diet - low sodium heart healthy   Complete by: As directed    Discharge instructions   Complete by: As directed    Okay to shower. Do not apply salves or appointments to incision. No heavy lifting with the upper extremities greater than 10 pounds. May resume driving when not requiring pain medication and patient feels comfortable with doing so.   Incentive spirometry RT   Complete by: As directed    Increase activity slowly   Complete by: As directed       Allergies as of 01/17/2024       Reactions   Fluoxetine Itching   Hydrocodone-acetaminophen  Other (See Comments)   Unknown   Ultram [tramadol]    "makes my heart beat fast"        Medication List     TAKE these  medications    albuterol  108 (90 Base) MCG/ACT inhaler Commonly known as: VENTOLIN  HFA Inhale 2 puffs into the lungs every 4 (four) hours as needed for wheezing or shortness of breath.   ALPRAZolam  1 MG tablet Commonly known as: XANAX  Take 2 mg by mouth 2 (two) times daily as needed for anxiety.   amLODipine  5 MG tablet Commonly known as: NORVASC  Take 5 mg by mouth in the morning.   dexamethasone  1 MG tablet Commonly known as: DECADRON  2 tablets twice daily for 2 days, one tablet twice daily for 2 days, one tablet daily for 2 days.   famotidine  20 MG tablet Commonly known as: PEPCID  Take 20 mg by mouth in the morning.   levothyroxine  112 MCG tablet Commonly known as: SYNTHROID  Take 100 mcg by mouth daily before breakfast.   meloxicam  15 MG tablet Commonly known as: MOBIC  Take 15 mg by mouth in the morning.   methocarbamol  500 MG tablet Commonly known as: ROBAXIN  Take 1 tablet (500 mg total) by mouth every 6 (six) hours as needed for muscle spasms.   oxyCODONE -acetaminophen  10-325 MG tablet Commonly known as: PERCOCET Take 1 tablet by mouth every 4 (four) hours as needed for pain.   rosuvastatin  40 MG tablet Commonly known as: CRESTOR  Take 40 mg by mouth at bedtime.   sertraline  50 MG tablet Commonly known as: ZOLOFT  Take 50 mg by mouth in the morning.   spironolactone -hydrochlorothiazide  25-25 MG tablet Commonly known as: ALDACTAZIDE   Take 1 tablet by mouth in the morning.   VITAMIN D3 PO Take 1 tablet by mouth in the morning.         Signed: Kevin Pellant Kassi Esteve 01/17/2024, 8:11 AM

## 2024-01-17 NOTE — Discharge Instructions (Signed)
 Wound Care REMOVE DRESSING IN 3 DAYS Leave incision open to air. You may shower. Do not scrub directly on incision.  Do not put any creams, lotions, or ointments on incision. Activity Walk each and every day, increasing distance each day. No lifting greater than 8 lbs.  Avoid bending, arching, and twisting. No driving for 2 weeks; may ride as a passenger locally. If provided with back brace, wear when out of bed.  It is not necessary to wear in bed. Diet Resume your normal diet.   Call Your Doctor If Any of These Occur Redness, drainage, or swelling at the wound.  Temperature greater than 101 degrees. Severe pain not relieved by pain medication. Incision starts to come apart. Follow Up Appt Call today for appointment in 3 weeks [phone]) or for problems.  If you have any hardware placed in your spine, you will need an x-ray before your appointment.

## 2024-01-17 NOTE — Progress Notes (Signed)
 Patient awaiting family for discharge home, Patient in no acute distress nor complaints of pain nor discomfort; incision on hip is clean, dry and intact; No c/o pain at this time. Room was checked and accounted for all patient's belongings; discharge instructions concerning her medications, incision care, follow up appointment and when to call the doctor as needed were all discussed with patient by RN and she expressed understanding on the instructions given.

## 2024-01-17 NOTE — Plan of Care (Signed)

## 2024-01-18 MED FILL — Sodium Chloride IV Soln 0.9%: INTRAVENOUS | Qty: 1000 | Status: AC

## 2024-01-18 MED FILL — Heparin Sodium (Porcine) Inj 1000 Unit/ML: INTRAMUSCULAR | Qty: 30 | Status: AC

## 2024-02-11 ENCOUNTER — Ambulatory Visit: Admitting: Cardiology

## 2024-02-20 ENCOUNTER — Ambulatory Visit: Admitting: Cardiology

## 2024-03-20 ENCOUNTER — Other Ambulatory Visit: Payer: Self-pay | Admitting: Neurological Surgery

## 2024-03-20 DIAGNOSIS — M4316 Spondylolisthesis, lumbar region: Secondary | ICD-10-CM

## 2024-05-05 ENCOUNTER — Ambulatory Visit
Admission: RE | Admit: 2024-05-05 | Discharge: 2024-05-05 | Disposition: A | Discharge status: Home / Self Care | Source: Ambulatory Visit | Attending: Neurological Surgery | Admitting: Neurological Surgery

## 2024-05-05 DIAGNOSIS — M4316 Spondylolisthesis, lumbar region: Secondary | ICD-10-CM
